# Patient Record
Sex: Male | Born: 1980 | Race: White | Hispanic: No | Marital: Married | Smoking: Current every day smoker
Health system: Southern US, Community
[De-identification: ages and names within clinical notes are randomized; demographics above are authoritative.]

## PROBLEM LIST (undated history)

## (undated) DIAGNOSIS — I10 Essential (primary) hypertension: Secondary | ICD-10-CM

## (undated) HISTORY — PX: ADENOIDECTOMY: SUR15

## (undated) HISTORY — PX: MOUTH SURGERY: SHX715

## (undated) HISTORY — PX: BACK SURGERY: SHX140

---

## 1999-06-08 ENCOUNTER — Emergency Department (HOSPITAL_COMMUNITY): Admission: EM | Admit: 1999-06-08 | Discharge: 1999-06-08 | Payer: Self-pay | Admitting: Endocrinology

## 2001-06-14 ENCOUNTER — Emergency Department (HOSPITAL_COMMUNITY): Admission: EM | Admit: 2001-06-14 | Discharge: 2001-06-14 | Payer: Self-pay | Admitting: *Deleted

## 2004-01-07 ENCOUNTER — Emergency Department (HOSPITAL_COMMUNITY): Admission: AD | Admit: 2004-01-07 | Discharge: 2004-01-07 | Payer: Self-pay | Admitting: Family Medicine

## 2004-04-03 ENCOUNTER — Emergency Department (HOSPITAL_COMMUNITY): Admission: EM | Admit: 2004-04-03 | Discharge: 2004-04-03 | Payer: Self-pay | Admitting: Family Medicine

## 2004-05-11 ENCOUNTER — Emergency Department (HOSPITAL_COMMUNITY): Admission: EM | Admit: 2004-05-11 | Discharge: 2004-05-11 | Payer: Self-pay | Admitting: *Deleted

## 2004-07-10 ENCOUNTER — Emergency Department (HOSPITAL_COMMUNITY): Admission: EM | Admit: 2004-07-10 | Discharge: 2004-07-10 | Payer: Self-pay | Admitting: *Deleted

## 2004-11-21 ENCOUNTER — Emergency Department (HOSPITAL_COMMUNITY): Admission: EM | Admit: 2004-11-21 | Discharge: 2004-11-21 | Payer: Self-pay | Admitting: Family Medicine

## 2005-01-25 ENCOUNTER — Emergency Department (HOSPITAL_COMMUNITY): Admission: EM | Admit: 2005-01-25 | Discharge: 2005-01-25 | Payer: Self-pay | Admitting: Family Medicine

## 2005-04-02 ENCOUNTER — Ambulatory Visit: Payer: Self-pay | Admitting: Internal Medicine

## 2005-04-15 ENCOUNTER — Emergency Department (HOSPITAL_COMMUNITY): Admission: EM | Admit: 2005-04-15 | Discharge: 2005-04-15 | Payer: Self-pay | Admitting: Family Medicine

## 2005-09-09 ENCOUNTER — Ambulatory Visit: Payer: Self-pay | Admitting: Internal Medicine

## 2006-01-19 ENCOUNTER — Emergency Department (HOSPITAL_COMMUNITY): Admission: EM | Admit: 2006-01-19 | Discharge: 2006-01-19 | Payer: Self-pay | Admitting: Emergency Medicine

## 2006-01-21 ENCOUNTER — Emergency Department (HOSPITAL_COMMUNITY): Admission: EM | Admit: 2006-01-21 | Discharge: 2006-01-21 | Payer: Self-pay | Admitting: Emergency Medicine

## 2006-03-16 ENCOUNTER — Emergency Department (HOSPITAL_COMMUNITY): Admission: EM | Admit: 2006-03-16 | Discharge: 2006-03-16 | Payer: Self-pay | Admitting: Emergency Medicine

## 2006-05-01 ENCOUNTER — Emergency Department (HOSPITAL_COMMUNITY): Admission: EM | Admit: 2006-05-01 | Discharge: 2006-05-01 | Payer: Self-pay | Admitting: Family Medicine

## 2006-08-26 ENCOUNTER — Emergency Department (HOSPITAL_COMMUNITY): Admission: EM | Admit: 2006-08-26 | Discharge: 2006-08-26 | Payer: Self-pay | Admitting: Family Medicine

## 2006-08-31 ENCOUNTER — Emergency Department (HOSPITAL_COMMUNITY): Admission: EM | Admit: 2006-08-31 | Discharge: 2006-08-31 | Payer: Self-pay | Admitting: *Deleted

## 2006-09-05 ENCOUNTER — Ambulatory Visit (HOSPITAL_COMMUNITY): Admission: RE | Admit: 2006-09-05 | Discharge: 2006-09-05 | Payer: Self-pay | Admitting: Sports Medicine

## 2006-09-23 ENCOUNTER — Encounter (HOSPITAL_COMMUNITY): Admission: RE | Admit: 2006-09-23 | Discharge: 2006-10-23 | Payer: Self-pay | Admitting: Sports Medicine

## 2007-01-15 ENCOUNTER — Ambulatory Visit (HOSPITAL_COMMUNITY): Admission: RE | Admit: 2007-01-15 | Discharge: 2007-01-15 | Payer: Self-pay | Admitting: Otolaryngology

## 2009-07-25 ENCOUNTER — Emergency Department (HOSPITAL_COMMUNITY): Admission: EM | Admit: 2009-07-25 | Discharge: 2009-07-25 | Payer: Self-pay | Admitting: Emergency Medicine

## 2009-09-28 ENCOUNTER — Emergency Department (HOSPITAL_COMMUNITY): Admission: EM | Admit: 2009-09-28 | Discharge: 2009-09-28 | Payer: Self-pay | Admitting: Family Medicine

## 2009-10-21 ENCOUNTER — Emergency Department (HOSPITAL_COMMUNITY): Admission: EM | Admit: 2009-10-21 | Discharge: 2009-10-21 | Payer: Self-pay | Admitting: Family Medicine

## 2010-01-19 ENCOUNTER — Ambulatory Visit: Payer: Self-pay | Admitting: Internal Medicine

## 2010-12-31 ENCOUNTER — Emergency Department (HOSPITAL_COMMUNITY)
Admission: EM | Admit: 2010-12-31 | Discharge: 2010-12-31 | Payer: Self-pay | Source: Home / Self Care | Admitting: Emergency Medicine

## 2011-01-07 LAB — RAPID STREP SCREEN (MED CTR MEBANE ONLY): Streptococcus, Group A Screen (Direct): NEGATIVE

## 2011-01-13 ENCOUNTER — Encounter: Payer: Self-pay | Admitting: Internal Medicine

## 2011-04-08 ENCOUNTER — Inpatient Hospital Stay (INDEPENDENT_AMBULATORY_CARE_PROVIDER_SITE_OTHER)
Admission: RE | Admit: 2011-04-08 | Discharge: 2011-04-08 | Disposition: A | Discharge status: Home / Self Care | Payer: Self-pay | Source: Ambulatory Visit | Attending: Emergency Medicine | Admitting: Emergency Medicine

## 2011-04-08 DIAGNOSIS — S335XXA Sprain of ligaments of lumbar spine, initial encounter: Secondary | ICD-10-CM

## 2011-05-10 NOTE — Consult Note (Signed)
NAME:  DARCY, CORDNER NO.:  [id number]   MEDICAL RECORD NO.:  [id number]          PATIENT TYPE:  EMS   LOCATION:  MAJO                         FACILITY:  [facility]   PHYSICIAN:  Kristine Garbe. Ezzard Standing, M.D.DATE OF BIRTH:  04/25/1981   DATE OF CONSULTATION:  04/15/2005  DATE OF DISCHARGE:  04/15/2005                                   CONSULTATION   REASON FOR CONSULTATION:  Evaluate patient with epiglottitis.   HISTORY OF PRESENT ILLNESS:  Mr. Zyrell Carmean is a 30 year old gentleman who  has had sore throats for the past two days.  It has been getting worse.  He  was seen at the walk-in clinic with an x-ray, which was consistent with  epiglottitis.  He was referred to the emergency room.  He is on IV.  He  received 1 g of Rocephin.  He states since he has been in the emergency room  since this morning, that he is doing much better.  He is having no airway  difficulty, with a minimal sore throat, and no difficulty talking or  breathing.   PHYSICAL EXAMINATION:  HEENT:  An oral exam revealed generous size 2+  tonsils.  I could visualize the epiglottis at the base of the tongue, with  no significant swelling noted.  A fiberoptic laryngoscopy was performed at  bedside, and on fiberoptic laryngoscopy the right side epiglottis was  perfectly normal.  The left side had a little bit of left clear glottic  swelling.  This was minimal, sitting there on the left periform sinus region  and not causing any airway difficulty.  The remaining vocal cords were  clear.  The glottis was widely patent and clear.  He is having minimal sore  throat presently.  He has no swelling in his neck.  On examination of his  ears, he has a left serous otitis.  He has posterior superior retraction  pockets of both tympanic membranes with what appears to be erosion of the  long process of the incus and questionable early cholesteatoma.   IMPRESSION:  1.  Minimal epiglottitis.  2.  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   RECOMMENDATIONS:  Will give him another gram of Rocephin IV in the emergency  room, and discharge home on Ceftin 500 mg b.i.d. for one week.  Will have  him follow up in my office later this week for a recheck and followup.      CEN/MEDQ  D:  04/15/2005  T:  04/15/2005  Job:  [id number]

## 2012-03-07 ENCOUNTER — Emergency Department (HOSPITAL_COMMUNITY)
Admission: EM | Admit: 2012-03-07 | Discharge: 2012-03-08 | Disposition: A | Discharge status: Home / Self Care | Payer: Medicaid Other | Attending: Emergency Medicine | Admitting: Emergency Medicine

## 2012-03-07 DIAGNOSIS — K047 Periapical abscess without sinus: Secondary | ICD-10-CM

## 2012-03-07 DIAGNOSIS — I10 Essential (primary) hypertension: Secondary | ICD-10-CM

## 2012-03-07 DIAGNOSIS — K044 Acute apical periodontitis of pulpal origin: Secondary | ICD-10-CM | POA: Insufficient documentation

## 2012-03-07 HISTORY — DX: Essential (primary) hypertension: I10

## 2012-03-08 ENCOUNTER — Encounter (HOSPITAL_COMMUNITY): Payer: Self-pay | Admitting: Emergency Medicine

## 2012-03-08 MED ORDER — HYDROCODONE-ACETAMINOPHEN 5-325 MG PO TABS: 1.0000 | ORAL_TABLET | ORAL | Status: AC | PRN

## 2012-03-08 MED ORDER — HYDROCHLOROTHIAZIDE 25 MG PO TABS: 25.0000 mg | ORAL_TABLET | Freq: Every day | ORAL | Status: DC

## 2012-03-08 MED ORDER — PENICILLIN V POTASSIUM 500 MG PO TABS: 500.0000 mg | ORAL_TABLET | Freq: Three times a day (TID) | ORAL | Status: AC

## 2012-03-08 NOTE — ED Provider Notes (Signed)
History     CSN: [id number]  Arrival date & time 03/07/12  2250   None     Chief Complaint  Patient presents with  . Abscess  . Oral Swelling    (Consider location/radiation/quality/duration/timing/severity/associated sxs/prior treatment) HPI History provided by pt.   Pt has had a severe, non-radiating, left lower toothache x 5 days.  Mild relief w/ ibuprofen and ice water.  Associated w/ edema of left jaw but no fever.  Does not currently have a dentist.  Pt also reports that he has been out of his HCTZ for a long time and does not currently have a PCP.   History reviewed. No pertinent past medical history.  Past Surgical History  Procedure Date  . Mouth surgery     History reviewed. No pertinent family history.  History  Substance Use Topics  . Smoking status: Not on file  . Smokeless tobacco: Not on file  . Alcohol Use: Not on file      Review of Systems  All other systems reviewed and are negative.    Allergies  Review of patient's allergies indicates not on file.  Home Medications  No current outpatient prescriptions on file.  BP 156/93  Pulse 87  Temp(Src) 98.5 F (36.9 C) (Oral)  Resp 20  SpO2 99%  Physical Exam  Nursing note and vitals reviewed. Constitutional: He is oriented to person, place, and time. He appears well-developed and well-nourished. No distress.  HENT:  Head: Normocephalic and atraumatic. No trismus in the jaw.  Mouth/Throat: Uvula is midline and oropharynx is clear and moist.       Diffuse dental decay and several missing teeth.  Left lower 1st molar avulsed w/ only a tiny fragment remaining.  3rd molar partially avulsed on medial aspect.  Both decayed and ttp.  Adjacent gingiva and buccal mucosa appears normal; no obvious edema.  Eyes:       Normal appearance  Neck: Normal range of motion. Neck supple.  Lymphadenopathy:    He has no cervical adenopathy.  Neurological: He is alert and oriented to person, place, and time.    Psychiatric: He has a normal mood and affect. His behavior is normal.    ED Course  Procedures (including critical care time)  Labs Reviewed - No data to display No results found.   1. Dental infection   2. Hypertension       MDM  Pt presents w/ left lower toothache.  Possible periapical abscess of 1st or 3rd molar based on exam.  D/c'd home w/ penicillin and vicodin (as well as refill for HCTZ) and referral to dentist on call as well as several low cost dental clinics in [place] and [facility] to find a family physician.  Return precautions discussed.         Otilio Miu, [place] 03/08/12 [phone]

## 2012-03-08 NOTE — ED Provider Notes (Signed)
Medical screening examination/treatment/procedure(s) were performed by non-physician practitioner and as supervising physician I was immediately available for consultation/collaboration.   Gwyneth Sprout, MD 03/08/12 [phone]

## 2012-03-08 NOTE — Discharge Instructions (Signed)
Take antibiotic as prescribed. Take vicodin as prescribed for severe pain.   Do not drive within four hours of taking this medication (may cause drowsiness or confusion).  Take ibuprofen w/ food up to three times a day, as well.  Follow up with a dentist as soon as possible.  Attached is a list of low cost dental clinics in [place] and [place] or you can follow up with the dentist on call.  You should return to the ER if you develop worsening symptoms or facial swelling.  

## 2012-03-08 NOTE — ED Notes (Signed)
Pt presented to the Er with c/o left side pain secondary to the dental pain, pt states noted abscess but not sure from what tooth

## 2013-03-13 ENCOUNTER — Encounter (HOSPITAL_COMMUNITY): Payer: Self-pay | Admitting: *Deleted

## 2013-03-13 ENCOUNTER — Emergency Department (HOSPITAL_COMMUNITY)
Admission: EM | Admit: 2013-03-13 | Discharge: 2013-03-13 | Disposition: A | Discharge status: Home / Self Care | Payer: Medicaid Other | Attending: Emergency Medicine | Admitting: Emergency Medicine

## 2013-03-13 DIAGNOSIS — R109 Unspecified abdominal pain: Secondary | ICD-10-CM | POA: Insufficient documentation

## 2013-03-13 DIAGNOSIS — M549 Dorsalgia, unspecified: Secondary | ICD-10-CM

## 2013-03-13 DIAGNOSIS — M545 Low back pain, unspecified: Secondary | ICD-10-CM | POA: Insufficient documentation

## 2013-03-13 DIAGNOSIS — Z79899 Other long term (current) drug therapy: Secondary | ICD-10-CM | POA: Insufficient documentation

## 2013-03-13 DIAGNOSIS — R110 Nausea: Secondary | ICD-10-CM | POA: Insufficient documentation

## 2013-03-13 DIAGNOSIS — R5383 Other fatigue: Secondary | ICD-10-CM | POA: Insufficient documentation

## 2013-03-13 DIAGNOSIS — F17200 Nicotine dependence, unspecified, uncomplicated: Secondary | ICD-10-CM | POA: Insufficient documentation

## 2013-03-13 DIAGNOSIS — R5381 Other malaise: Secondary | ICD-10-CM | POA: Insufficient documentation

## 2013-03-13 DIAGNOSIS — I10 Essential (primary) hypertension: Secondary | ICD-10-CM | POA: Insufficient documentation

## 2013-03-13 DIAGNOSIS — R51 Headache: Secondary | ICD-10-CM | POA: Insufficient documentation

## 2013-03-13 DIAGNOSIS — R42 Dizziness and giddiness: Secondary | ICD-10-CM

## 2013-03-13 LAB — CBC WITH DIFFERENTIAL/PLATELET
Basophils Absolute: 0.1 10*3/uL (ref 0.0–0.1)
Basophils Relative: 0 % (ref 0–1)
Eosinophils Absolute: 0.2 10*3/uL (ref 0.0–0.7)
Eosinophils Relative: 2 % (ref 0–5)
HCT: 45.9 % (ref 39.0–52.0)
Hemoglobin: 16.6 g/dL (ref 13.0–17.0)
Lymphocytes Relative: 26 % (ref 12–46)
Lymphs Abs: 3.1 10*3/uL (ref 0.7–4.0)
MCH: 30.2 pg (ref 26.0–34.0)
MCHC: 36.2 g/dL — ABNORMAL HIGH (ref 30.0–36.0)
MCV: 83.6 fL (ref 78.0–100.0)
Monocytes Absolute: 0.8 10*3/uL (ref 0.1–1.0)
Monocytes Relative: 7 % (ref 3–12)
Neutro Abs: 7.8 10*3/uL — ABNORMAL HIGH (ref 1.7–7.7)
Neutrophils Relative %: 65 % (ref 43–77)
Platelets: 174 10*3/uL (ref 150–400)
RBC: 5.49 MIL/uL (ref 4.22–5.81)
RDW: 12.5 % (ref 11.5–15.5)
WBC: 12 10*3/uL — ABNORMAL HIGH (ref 4.0–10.5)

## 2013-03-13 LAB — BASIC METABOLIC PANEL
BUN: 13 mg/dL (ref 6–23)
CO2: 28 mEq/L (ref 19–32)
Calcium: 11 mg/dL — ABNORMAL HIGH (ref 8.4–10.5)
Chloride: 100 mEq/L (ref 96–112)
Creatinine, Ser: 0.91 mg/dL (ref 0.50–1.35)
GFR calc Af Amer: >90 mL/min (ref >90)
GFR calc non Af Amer: >90 mL/min (ref >90)
Glucose, Bld: 91 mg/dL (ref 70–99)
Potassium: 4.4 mEq/L (ref 3.5–5.1)
Sodium: 140 mEq/L (ref 135–145)

## 2013-03-13 LAB — URINALYSIS, ROUTINE W REFLEX MICROSCOPIC
Bilirubin Urine: NEGATIVE
Glucose, UA: NEGATIVE mg/dL
Hgb urine dipstick: NEGATIVE
Ketones, ur: NEGATIVE mg/dL
Leukocytes, UA: NEGATIVE
Nitrite: NEGATIVE
Protein, ur: NEGATIVE mg/dL
Specific Gravity, Urine: 1.013 (ref 1.005–1.030)
Urobilinogen, UA: 0.2 mg/dL (ref 0.0–1.0)
pH: 7 (ref 5.0–8.0)

## 2013-03-13 LAB — POCT I-STAT TROPONIN I: Troponin i, poc: 0 ng/mL (ref 0.00–0.08)

## 2013-03-13 NOTE — ED Notes (Signed)
Patient c/o dizziness for about dizziness for the past few days and last night he states that his left arm became numb and was in pain.  Patient also c/o lower back.  Patient has history of htn.  Patient was working tonight at work and his dizziness became really dizzy so he came to work for further evaluation.  Patient has shortness of breath.  Patient also states that he inhaled anti freeze fumes Wednesday night

## 2013-03-13 NOTE — ED Provider Notes (Signed)
History     CSN: [id number]  Arrival date & time 03/13/13  1903   First MD Initiated Contact with Patient 03/13/13 2100      Chief Complaint  Patient presents with  . 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 He had been working for several hours and had not eaten anything and his job involves bending over to get pizzas out of the oven and standing up and twisting and placing them into boxes. He had sudden severe pain in his low back bilaterally which reports almost down to his knees. He denied any numbness or weakness. He reports he's had a chronic problem with dizzy spells, worse when he turns his head or turns his body associated with a moving sensation and occasional mild nausea. He reports that he has had a right inner ear problem in the past. He denies any recent URI symptoms. He reports yesterday he was working on a generator problem and he thinks he was exposed to fumes with a potent odor for several hours although he was working outside. He reports after finishing his work and fixing a problem, he felt very dizzy and lightheaded and did get a bed early and slept most of the evening. He denied any sweats or chest pain. He denies any recent exertional chest pain. He denies also any abdominal pain, groin pain, skin rash. Here upon my evaluation, the patient reports feeling much improved, no longer is having any back pain and denied any hematuria, dysuria or urinary frequency.  The history is provided by the patient and the spouse.    Past Medical History  Diagnosis Date  . Hypertension     Past Surgical History  Procedure Laterality Date  . Mouth surgery      History reviewed. No pertinent family history.  History  Substance Use Topics  . Smoking status: Current Every Day Smoker -- 1.00 packs/day  . Smokeless tobacco: Not on file  . Alcohol  Use: No      Review of Systems  Constitutional: Negative for fever and chills.  Respiratory: Negative for chest tightness and shortness of breath.   Cardiovascular: Negative for chest pain, palpitations and leg swelling.  Gastrointestinal: Negative for nausea and vomiting.  Genitourinary: Positive for flank pain. Negative for dysuria, hematuria, scrotal swelling and testicular pain.  Musculoskeletal: Positive for back pain.  Neurological: Positive for dizziness, weakness and headaches.  All other systems reviewed and are negative.    Allergies  Review of patient's allergies indicates no known allergies.  Home Medications   Current Outpatient Rx  Name  Route  Sig  Dispense  Refill  . aspirin-acetaminophen-caffeine (EXCEDRIN MIGRAINE) 250-250-65 MG per tablet   Oral   Take 1 tablet by mouth every 6 (six) hours as needed for pain.         . hydrochlorothiazide (HYDRODIURIL) 25 MG tablet   Oral   Take 25 mg by mouth daily.         Marland Kitchen ibuprofen (ADVIL,MOTRIN) 200 MG tablet   Oral   Take 800 mg by mouth 3 (three) times daily as needed. For pain         . EXPIRED: hydrochlorothiazide (HYDRODIURIL) 25 MG tablet   Oral   Take 1 tablet (25 mg total) by mouth daily.   30 tablet   0     BP 141/78  Pulse 74  Temp(Src) 98 F (36.7 C) (Oral)  Resp  18  Ht 6\' 5"  (1.956 m)  Wt 330 lb (149.687 kg)  BMI 39.12 kg/m2  SpO2 99%  Physical Exam  Nursing note and vitals reviewed. Constitutional: He is oriented to person, place, and time. He appears well-developed and well-nourished.  HENT:  Head: Normocephalic and atraumatic.  Eyes: EOM are normal. No scleral icterus.  Neck: Normal range of motion. Neck supple. No JVD present.  Cardiovascular: Normal rate and regular rhythm.   No murmur heard. Pulmonary/Chest: Effort normal. No respiratory distress. He has no wheezes.  Abdominal: Soft. He exhibits no distension. There is no tenderness.  Musculoskeletal: He exhibits no  edema.  Neurological: He is alert and oriented to person, place, and time. He displays normal reflexes. No cranial nerve deficit. He exhibits normal muscle tone. Coordination and gait normal. GCS eye subscore is 4. GCS verbal subscore is 5. GCS motor subscore is 6.  Skin: Skin is dry.  Psychiatric: He has a normal mood and affect.    ED Course  Procedures (including critical care time)  Labs Reviewed  CBC WITH DIFFERENTIAL - Abnormal; Notable for the following:    WBC 12.0 (*)    MCHC 36.2 (*)    Neutro Abs 7.8 (*)    All other components within normal limits  BASIC METABOLIC PANEL - Abnormal; Notable for the following:    Calcium 11.0 (*)    All other components within normal limits  URINALYSIS, ROUTINE W REFLEX MICROSCOPIC  POCT I-STAT TROPONIN I   No results found.   1. Back pain   2. Vertigo     EKG at time 19:15, shows sinus tachycardia at a rate of 104, normal axis, normal intervals, no ST or T-wave abnormalities. Interpretation is normal EKG except for tachycardic rate. No prior EKGs are available.  Room air saturation is 99% and I interpret this to be normal  MDM  Patient has multiple complaints, many of which I do not think are necessarily related. At this time the patient feels much improved without any specific intervention. I did do not suspect acute coronary syndrome, acute stroke. It seems the patient may have had some dizziness and vertiginous symptoms secondary to fume inhalation yesterday it seems to be improving. 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 He spent several hours in a hot kitchen bending over and getting pieces of an oven and standing up and placing them into boxes. At this time he has no back pain, no tenderness, urinalysis is unremarkable.        Gavin Pound. Chayil Gantt, MD 03/13/13 2248

## 2013-03-13 NOTE — Discharge Instructions (Signed)
 Back Pain, Adult Low back pain is very common. About 1 in 5 people have back pain.The cause of low back pain is rarely dangerous. The pain often gets better over time.About half of people with a sudden onset of back pain feel better in just 2 weeks. About 8 in 10 people feel better by 6 weeks.  CAUSES Some common causes of back pain include:  Strain of the muscles or ligaments supporting the spine.  Wear and tear (degeneration) of the spinal discs.  Arthritis.  Direct injury to the back. DIAGNOSIS Most of the time, the direct cause of low back pain is not known.However, back pain can be treated effectively even when the exact cause of the pain is unknown.Answering your caregiver's questions about your overall health and symptoms is one of the most accurate ways to make sure the cause of your pain is not dangerous. If your caregiver needs more information, he or she may order lab work or imaging tests (X-rays or MRIs).However, even if imaging tests show changes in your back, this usually does not require surgery. HOME CARE INSTRUCTIONS For many people, back pain returns.Since low back pain is rarely dangerous, it is often a condition that people can learn to [facility] their own.   Remain active. It is stressful on the back to sit or stand in one place. Do not sit, drive, or stand in one place for more than 30 minutes at a time. Take short walks on level surfaces as soon as pain allows.Try to increase the length of time you walk each day.  Do not stay in bed.Resting more than 1 or 2 days can delay your recovery.  Do not avoid exercise or work.Your body is made to move.It is not dangerous to be active, even though your back may hurt.Your back will likely heal faster if you return to being active before your pain is gone.  Pay attention to your body when you bend and lift. Many people have less discomfortwhen lifting if they bend their knees, keep the load close to their bodies,and  avoid twisting. Often, the most comfortable positions are those that put less stress on your recovering back.  Find a comfortable position to sleep. Use a firm mattress and lie on your side with your knees slightly bent. If you lie on your back, put a pillow under your knees.  Only take over-the-counter or prescription medicines as directed by your caregiver. Over-the-counter medicines to reduce pain and inflammation are often the most helpful.Your caregiver may prescribe muscle relaxant drugs.These medicines help dull your pain so you can more quickly return to your normal activities and healthy exercise.  Put ice on the injured area.  Put ice in a plastic bag.  Place a towel between your skin and the bag.  Leave the ice on for 15 to 20 minutes, 3 to 4 times a day for the first 2 to 3 days. After that, ice and heat may be alternated to reduce pain and spasms.  Ask your caregiver about trying back exercises and gentle massage. This may be of some benefit.  Avoid feeling anxious or stressed.Stress increases muscle tension and can worsen back pain.It is important to recognize when you are anxious or stressed and learn ways to manage it.Exercise is a great option. SEEK MEDICAL CARE IF:  You have pain that is not relieved with rest or medicine.  You have pain that does not improve in 1 week.  You have new symptoms.  You are generally  not feeling well. SEEK IMMEDIATE MEDICAL CARE IF:   You have pain that radiates from your back into your legs.  You develop new bowel or bladder control problems.  You have unusual weakness or numbness in your arms or legs.  You develop nausea or vomiting.  You develop abdominal pain.  You feel faint. Document Released: 12/09/2005 Document Revised: 06/09/2012 Document Reviewed: 04/29/2011 [facility] Patient Information 2013 [place], [place].     Vertigo Vertigo means you feel like you or your surroundings are moving when they are not. Vertigo  can be dangerous if it occurs when you are at work, driving, or performing difficult activities.  CAUSES  Vertigo occurs when there is a conflict of signals sent to your brain from the visual and sensory systems in your body. There are many different causes of vertigo, including:  Infections, especially in the inner ear.  A bad reaction to a drug or misuse of alcohol and medicines.  Withdrawal from drugs or alcohol.  Rapidly changing positions, such as lying down or rolling over in bed.  A migraine headache.  Decreased blood flow to the brain.  Increased pressure in the brain from a head injury, infection, tumor, or bleeding. SYMPTOMS  You may feel as though the world is spinning around or you are falling to the ground. Because your balance is upset, vertigo can cause nausea and vomiting. You may have involuntary eye movements (nystagmus). DIAGNOSIS  Vertigo is usually diagnosed by physical exam. If the cause of your vertigo is unknown, your caregiver may perform imaging tests, such as an MRI scan (magnetic resonance imaging). TREATMENT  Most cases of vertigo resolve on their own, without treatment. Depending on the cause, your caregiver may prescribe certain medicines. If your vertigo is related to body position issues, your caregiver may recommend movements or procedures to correct the problem. In rare cases, if your vertigo is caused by certain inner ear problems, you may need surgery. HOME CARE INSTRUCTIONS   Follow your caregiver's instructions.  Avoid driving.  Avoid operating heavy machinery.  Avoid performing any tasks that would be dangerous to you or others during a vertigo episode.  Tell your caregiver if you notice that certain medicines seem to be causing your vertigo. Some of the medicines used to treat vertigo episodes can actually make them worse in some people. SEEK IMMEDIATE MEDICAL CARE IF:   Your medicines do not relieve your vertigo or are making it  worse.  You develop problems with talking, walking, weakness, or using your arms, hands, or legs.  You develop severe headaches.  Your nausea or vomiting continues or gets worse.  You develop visual changes.  A family member notices behavioral changes.  Your condition gets worse. MAKE SURE YOU:  Understand these instructions.  Will watch your condition.  Will get help right away if you are not doing well or get worse. Document Released: 09/18/2005 Document Revised: 03/02/2012 Document Reviewed: 06/27/2011 [facility] Patient Information 2013 [place], [place].

## 2013-03-13 NOTE — ED Notes (Signed)
Pt dc to home.   Pt states understanding to dc instructions.  Pt ambulatory to exit without difficulty.  Pt denies need for w/c. 

## 2013-06-04 ENCOUNTER — Encounter (HOSPITAL_COMMUNITY): Payer: Self-pay | Admitting: *Deleted

## 2013-06-04 ENCOUNTER — Emergency Department (HOSPITAL_COMMUNITY)
Admission: EM | Admit: 2013-06-04 | Discharge: 2013-06-04 | Disposition: A | Discharge status: Home / Self Care | Payer: Self-pay | Attending: Emergency Medicine | Admitting: Emergency Medicine

## 2013-06-04 DIAGNOSIS — F17200 Nicotine dependence, unspecified, uncomplicated: Secondary | ICD-10-CM | POA: Insufficient documentation

## 2013-06-04 DIAGNOSIS — I10 Essential (primary) hypertension: Secondary | ICD-10-CM | POA: Insufficient documentation

## 2013-06-04 DIAGNOSIS — Z79899 Other long term (current) drug therapy: Secondary | ICD-10-CM | POA: Insufficient documentation

## 2013-06-04 DIAGNOSIS — G43909 Migraine, unspecified, not intractable, without status migrainosus: Secondary | ICD-10-CM | POA: Insufficient documentation

## 2013-06-04 DIAGNOSIS — R112 Nausea with vomiting, unspecified: Secondary | ICD-10-CM | POA: Insufficient documentation

## 2013-06-04 MED ORDER — SODIUM CHLORIDE 0.9 % IV SOLN: 1000.0000 mL | INTRAVENOUS | Status: DC

## 2013-06-04 MED ORDER — DIPHENHYDRAMINE HCL 50 MG/ML IJ SOLN
25.0000 mg | Freq: Once | INTRAMUSCULAR | Status: AC
  Administered 2013-06-04: 25 mg via INTRAVENOUS
  Filled 2013-06-04: qty 1

## 2013-06-04 MED ORDER — METOCLOPRAMIDE HCL 10 MG PO TABS: 10.0000 mg | ORAL_TABLET | Freq: Four times a day (QID) | ORAL | Status: DC

## 2013-06-04 MED ORDER — SODIUM CHLORIDE 0.9 % IV SOLN
1000.0000 mL | Freq: Once | INTRAVENOUS | Status: AC
  Administered 2013-06-04: 500 mL via INTRAVENOUS

## 2013-06-04 MED ORDER — METOCLOPRAMIDE HCL 5 MG/ML IJ SOLN
10.0000 mg | Freq: Once | INTRAMUSCULAR | Status: AC
  Administered 2013-06-04: 10 mg via INTRAVENOUS
  Filled 2013-06-04: qty 2

## 2013-06-04 NOTE — ED Provider Notes (Signed)
History     CSN: [id number]  Arrival date & time 06/04/13  8295   First MD Initiated Contact with Patient 06/04/13 [phone]      Chief Complaint  Patient presents with  . Migraine    (Consider location/radiation/quality/duration/timing/severity/associated sxs/prior treatment) Patient is a 32 y.o. male presenting with migraines. The history is provided by the patient.  Migraine  He had onset about 5 PM of a severe, throbbing headache which started at the occiput and radiated up to the frontal area. Headache is worse with exposure to light or noise. There is associated nausea and vomiting. He has not any visual disturbance. He took a dose of Vicodin which did give some slight, temporary relief but pain got worse. He tried to take Excedrin but vomited after taking it. He tried taking a shower without any relief. He's had similar headaches in the past. He is a smoker-1 pack a day.  Past Medical History  Diagnosis Date  . Hypertension     Past Surgical History  Procedure Laterality Date  . Mouth surgery      No family history on file.  History  Substance Use Topics  . Smoking status: Current Every Day Smoker -- 1.00 packs/day  . Smokeless tobacco: Not on file  . Alcohol Use: No      Review of Systems  All other systems reviewed and are negative.    Allergies  Review of patient's allergies indicates no known allergies.  Home Medications   Current Outpatient Rx  Name  Route  Sig  Dispense  Refill  . aspirin-acetaminophen-caffeine (EXCEDRIN MIGRAINE) 250-250-65 MG per tablet   Oral   Take 1 tablet by mouth every 6 (six) hours as needed for pain.         Marland Kitchen HYDROcodone-acetaminophen (NORCO/VICODIN) 5-325 MG per tablet   Oral   Take 1 tablet by mouth every 6 (six) hours as needed for pain.         . hydrochlorothiazide (HYDRODIURIL) 25 MG tablet   Oral   Take 25 mg by mouth daily.           BP 155/75  Pulse 90  Temp(Src) 97.5 F (36.4 C) (Oral)  SpO2  99%  Physical Exam  Nursing note and vitals reviewed.  32 year old male, resting comfortably and in no acute distress. Vital signs are significant for mild hypertension with blood pressure 155/75. Oxygen saturation is 99%, which is normal. Head is normocephalic and atraumatic. PERRLA, EOMI. Oropharynx is clear. Neck is tender at the insertion of the paracervical muscles, neck is supple without adenopathy or JVD. Fundi show no hemorrhage, exudate, or papilledema. Back is nontender and there is no CVA tenderness. Lungs are clear without rales, wheezes, or rhonchi. Chest is nontender. Heart has regular rate and rhythm without murmur. Abdomen is soft, flat, nontender without masses or hepatosplenomegaly and peristalsis is normoactive. Extremities have no cyanosis or edema, full range of motion is present. Skin is warm and dry without rash. Neurologic: Mental status is normal, cranial nerves are intact, there are no motor or sensory deficits.  ED Course  Procedures (including critical care time)   1. Headache       MDM  Headache which may be a migraine and may be muscle contraction headache. No be treated with a headache cocktail of IV fluids, IV metoclopramide, and IV diphenhydramine.  He got excellent relief of headache with above noted treatment and is discharged with a prescription for metoclopramide.  Dione Booze, MD  06/04/13 0723 

## 2013-06-04 NOTE — ED Notes (Signed)
Migraine since yesterday; pt. Took a vicodin when the migraine came on but not effective; tried to take a shower, and prior to coming here x 2 excedrin.  Emesis intermittently.

## 2013-11-02 ENCOUNTER — Emergency Department (INDEPENDENT_AMBULATORY_CARE_PROVIDER_SITE_OTHER)
Admission: EM | Admit: 2013-11-02 | Discharge: 2013-11-02 | Disposition: A | Discharge status: Home / Self Care | Payer: Self-pay | Source: Home / Self Care | Attending: Family Medicine | Admitting: Family Medicine

## 2013-11-02 ENCOUNTER — Emergency Department (INDEPENDENT_AMBULATORY_CARE_PROVIDER_SITE_OTHER): Payer: Self-pay

## 2013-11-02 ENCOUNTER — Encounter (HOSPITAL_COMMUNITY): Payer: Self-pay | Admitting: Emergency Medicine

## 2013-11-02 DIAGNOSIS — J069 Acute upper respiratory infection, unspecified: Secondary | ICD-10-CM

## 2013-11-02 MED ORDER — IPRATROPIUM BROMIDE 0.06 % NA SOLN: 2.0000 | Freq: Four times a day (QID) | NASAL | Status: DC

## 2013-11-02 MED ORDER — HYDROCOD POLST-CHLORPHEN POLST 10-8 MG/5ML PO LQCR: 5.0000 mL | Freq: Two times a day (BID) | ORAL | Status: DC | PRN

## 2013-11-02 NOTE — ED Provider Notes (Signed)
CSN: [id number]     Arrival date & time 11/02/13  1500 History   None    Chief Complaint  Patient presents with  . URI   (Consider location/radiation/quality/duration/timing/severity/associated sxs/prior Treatment) Patient is a 32 y.o. male presenting with URI. The history is provided by the patient.  URI Presenting symptoms: congestion, cough, fever, rhinorrhea and sore throat   Severity:  Moderate Onset quality:  Gradual Progression:  Worsening Chronicity:  New   Past Medical History  Diagnosis Date  . Hypertension    Past Surgical History  Procedure Laterality Date  . Mouth surgery     History reviewed. No pertinent family history. History  Substance Use Topics  . Smoking status: Current Every Day Smoker -- 1.00 packs/day  . Smokeless tobacco: Not on file  . Alcohol Use: No    Review of Systems  Constitutional: Positive for fever and chills.  HENT: Positive for congestion, rhinorrhea and sore throat.   Respiratory: Positive for cough.   Gastrointestinal: Negative.     Allergies  Review of patient's allergies indicates no known allergies.  Home Medications   Current Outpatient Rx  Name  Route  Sig  Dispense  Refill  . aspirin-acetaminophen-caffeine (EXCEDRIN MIGRAINE) 250-250-65 MG per tablet   Oral   Take 1 tablet by mouth every 6 (six) hours as needed for pain.         . chlorpheniramine-HYDROcodone (TUSSIONEX PENNKINETIC ER) 10-8 MG/5ML LQCR   Oral   Take 5 mLs by mouth every 12 (twelve) hours as needed for cough.   115 mL   1   . HYDROcodone-acetaminophen (NORCO/VICODIN) 5-325 MG per tablet   Oral   Take 1 tablet by mouth every 6 (six) hours as needed for pain.         Marland Kitchen ipratropium (ATROVENT) 0.06 % nasal spray   Nasal   Place 2 sprays into the nose 4 (four) times daily.   15 mL   12   . metoCLOPramide (REGLAN) 10 MG tablet   Oral   Take 1 tablet (10 mg total) by mouth every 6 (six) hours.   30 tablet   0    BP 144/85  Pulse 97   Temp(Src) 98.3 F (36.8 C) (Oral)  Resp 16  SpO2 99% Physical Exam  Nursing note and vitals reviewed. Constitutional: He is oriented to person, place, and time. He appears well-developed and well-nourished.  HENT:  Head: Normocephalic.  Right Ear: External ear normal.  Left Ear: External ear normal.  Mouth/Throat: Uvula is midline and mucous membranes are normal. Posterior oropharyngeal edema and posterior oropharyngeal erythema present. No tonsillar abscesses.  Neck: Normal range of motion. Neck supple.  Cardiovascular: Normal rate, regular rhythm and normal heart sounds.   Pulmonary/Chest: Effort normal and breath sounds normal.  Abdominal: Soft. Bowel sounds are normal. There is no tenderness.  Lymphadenopathy:    He has cervical adenopathy.  Neurological: He is alert and oriented to person, place, and time.  Skin: Skin is warm and dry.    ED Course  Procedures (including critical care time) Labs Review Labs Reviewed  POCT RAPID STREP A ([facility] ONLY)   Imaging Review Dg Chest 2 View  11/02/2013   CLINICAL DATA:  Cough, fever.  EXAM: CHEST  2 VIEW  COMPARISON:  None.  FINDINGS: Mild peribronchial thickening. Heart and mediastinal contours are within normal limits. No focal opacities or effusions. No acute bony abnormality.  IMPRESSION: Mild bronchitic changes.   Electronically Signed  By: Charlett Nose M.D.   On: 11/02/2013 16:39    EKG Interpretation     Ventricular Rate:    PR Interval:    QRS Duration:   QT Interval:    QTC Calculation:   R Axis:     Text Interpretation:              MDM  X-rays reviewed and report per radiologist.     Linna Hoff, MD 11/02/13 [phone]

## 2013-11-02 NOTE — ED Notes (Signed)
C/o migraine headache on Friday. Saturday head and chest congestion.  Productive cough with yellow sputum, chills, fever, back and neck pain from cough.  Pt has tried nyquil and ibuprofen with no relief. States wife has pneumonia and kids sick with URI.

## 2013-11-04 LAB — CULTURE, GROUP A STREP

## 2014-02-12 ENCOUNTER — Emergency Department (INDEPENDENT_AMBULATORY_CARE_PROVIDER_SITE_OTHER)
Admission: EM | Admit: 2014-02-12 | Discharge: 2014-02-12 | Disposition: A | Discharge status: Home / Self Care | Payer: Self-pay | Source: Home / Self Care

## 2014-02-12 ENCOUNTER — Encounter (HOSPITAL_COMMUNITY): Payer: Self-pay | Admitting: Emergency Medicine

## 2014-02-12 DIAGNOSIS — K122 Cellulitis and abscess of mouth: Secondary | ICD-10-CM

## 2014-02-12 LAB — POCT RAPID STREP A: STREPTOCOCCUS, GROUP A SCREEN (DIRECT): NEGATIVE

## 2014-02-12 MED ORDER — AMOXICILLIN 500 MG PO CAPS: 1000.0000 mg | ORAL_CAPSULE | Freq: Three times a day (TID) | ORAL | Status: DC

## 2014-02-12 MED ORDER — HYDROCHLOROTHIAZIDE 25 MG PO TABS: 25.0000 mg | ORAL_TABLET | Freq: Every day | ORAL | Status: DC

## 2014-02-12 MED ORDER — PREDNISONE 20 MG PO TABS: 20.0000 mg | ORAL_TABLET | Freq: Two times a day (BID) | ORAL | Status: DC

## 2014-02-12 NOTE — Discharge Instructions (Signed)
Uvulitis °Uvulitis is redness and soreness (inflammation) of the uvula. The uvula is the small tongue-shaped piece of tissue in the back of your mouth.  °CAUSES °Infection is a common cause of uvulitis. Infection of the uvula can be either viral or bacterial. Infectious uvulitis usually only occurs in association with another condition, such as inflammation and infection of the mouth or throat.  °Other causes of uvulitis include: °· Trauma to the uvula. °· Swelling from excess fluid buildup (edema), which may be an allergic reaction. °· Inhalation of irritants, such as chemical agents, smoke, or steam. °DIAGNOSIS °Your caregiver can usually diagnose uvulitis through a physical examination. Bacterial uvulitis can be diagnosed through the results of the growth of samples of bodily substances taken from your mouth (cultures). °HOME CARE INSTRUCTIONS  °· Rest as much as possible. °· Young children may suck on frozen juice bars or frozen ice pops. Older children and adults may gargle with a warm or cold liquid to help soothe the throat. (Mix ¼ tsp of salt in 8 oz of water, or use strong tea.) °· Use a cool-mist humidifier to lessen throat irritation and cough. °· Drink enough fluids to keep your urine clear or pale yellow. °· While the throat is very sore, eat soft or liquid foods such as milk, ice cream, soups, or milk drinks. °· Family members who develop a sore throat or fever should have a medical exam or throat culture. °· If your child has uvulitis and is taking antibiotic medicine, wait 24 hours or until his or her temperature is near normal (less than 100° F [37.8° C]) before allowing him or her to return to school or day care. °· Only take over-the-counter or prescription medicines for pain, discomfort, or fever as directed by your caregiver. °Ask when your test results will be ready. Make sure you get your test results.  °SEEK MEDICAL CARE IF:  °· You have an oral temperature above 102° F (38.9° C). °· You  develop large, tender lumps your the neck. °· Your child develops a rash. °· You cough up green, yellow-brown, or bloody substances. °SEEK IMMEDIATE MEDICAL CARE IF:  °· You develop any new symptoms, such as vomiting, earache, severe headache, stiff neck, chest pain, or trouble breathing or swallowing. °· Your airway is blocked. °· You develop more severe throat pain along with drooling or voice changes. °Document Released: 07/19/2004 Document Revised: 03/02/2012 Document Reviewed: 02/14/2011 °[employer]® Patient Information ©2014 [employer]. ° °

## 2014-02-12 NOTE — ED Provider Notes (Signed)
Chief Complaint   Chief Complaint  Patient presents with  . Sore Throat    History of Present Illness   Nicholas Choi is a 33 year old male who has had a history since this morning of a sore throat, swelling of the uvula, and a sensation of difficulty swallowing, but is able to swallow liquids and solids. He denies any difficulty breathing. He's had no fever, chills, headache, nasal congestion, rhinorrhea, swollen glands, cough, or GI symptoms. He was exposed to strep but this was several weeks ago.   Review of Systems   Other than as noted above, the patient denies any of the following symptoms. Systemic:  No fever, chills, sweats, myalgias, or headache. Eye:  No redness, pain or drainage. ENT:  No earache, nasal congestion, sneezing, rhinorrhea, sinus pressure, sinus pain, or post nasal drip. Lungs:  No cough, sputum production, wheezing, shortness of breath, or chest pain. GI:  No abdominal pain, nausea, vomiting, or diarrhea. Skin:  No rash.  PMFSH   Past medical history, family history, social history, meds, and allergies were reviewed. He has a history of epiglottitis in the past.  Physical Exam     Vital signs:  BP 154/91  Pulse 75  Temp(Src) 98.5 F (36.9 C) (Oral)  Resp 18  SpO2 100% General:  Alert, in no distress. Phonation was normal, no drooling, and patient was able to handle secretions well.  Eye:  No conjunctival injection or drainage. Lids were normal. ENT:  TMs and canals were normal, without erythema or inflammation.  Nasal mucosa was clear and uncongested, without drainage.  Mucous membranes were moist.  Exam of pharynx reveals the uvula to be swollen and edematous, without any ulcers or exudate.  There were no oral ulcerations or lesions. There was no bulging of the tonsillar pillars, and the uvula was midline. Neck:  Supple, no adenopathy, tenderness or mass. Lungs:  No respiratory distress.  Lungs were clear to auscultation, without wheezes, rales or  rhonchi.  Breath sounds were clear and equal bilaterally.  Heart:  Regular rhythm, without gallops, murmers or rubs. Skin:  Clear, warm, and dry, without rash or lesions.  Labs   Results for orders placed during the hospital encounter of 02/12/14  POCT RAPID STREP A ([facility] ONLY)      Result Value Ref Range   Streptococcus, Group A Screen (Direct) NEGATIVE  NEGATIVE   Assessment   The encounter diagnosis was Uvulitis.  There is no evidence of a peritonsillar abscess.    Plan     1.  Meds:  The following meds were prescribed:   New Prescriptions   AMOXICILLIN (AMOXIL) 500 MG CAPSULE    Take 2 capsules (1,000 mg total) by mouth 3 (three) times daily.   HYDROCHLOROTHIAZIDE (HYDRODIURIL) 25 MG TABLET    Take 1 tablet (25 mg total) by mouth daily.   PREDNISONE (DELTASONE) 20 MG TABLET    Take 1 tablet (20 mg total) by mouth 2 (two) times daily.    2.  Patient Education/Counseling:  The patient was given appropriate handouts, self care instructions, and instructed in symptomatic relief, including hot saline gargles, throat lozenges, infectious precautions, and need to trade out toothbrush.    3.  Follow up:  The patient was told to follow up here if no better in 3 to 4 days, or sooner if becoming worse in any way, and given some red flag symptoms such as difficulty swallowing or breathing which would prompt immediate return.  Reuben Likesavid C Kiarra Kidd, MD 02/12/14 [phone]

## 2014-02-12 NOTE — ED Notes (Signed)
Pt reports   sorethroat      With  Pain     When  He  Swallows   With    Some   sensation  Of  Swelling  And  Raw  Area on  Top  Of  Throat

## 2014-02-14 LAB — CULTURE, GROUP A STREP

## 2015-03-24 ENCOUNTER — Encounter (HOSPITAL_COMMUNITY): Payer: Self-pay | Admitting: *Deleted

## 2015-03-24 ENCOUNTER — Emergency Department (INDEPENDENT_AMBULATORY_CARE_PROVIDER_SITE_OTHER)
Admission: EM | Admit: 2015-03-24 | Discharge: 2015-03-24 | Disposition: A | Discharge status: Home / Self Care | Payer: Self-pay | Source: Home / Self Care | Attending: Family Medicine | Admitting: Family Medicine

## 2015-03-24 DIAGNOSIS — I10 Essential (primary) hypertension: Secondary | ICD-10-CM

## 2015-03-24 DIAGNOSIS — J0111 Acute recurrent frontal sinusitis: Secondary | ICD-10-CM

## 2015-03-24 DIAGNOSIS — H7193 Unspecified cholesteatoma, bilateral: Secondary | ICD-10-CM

## 2015-03-24 MED ORDER — FLUTICASONE PROPIONATE 50 MCG/ACT NA SUSP: 1.0000 | Freq: Two times a day (BID) | NASAL | Status: DC

## 2015-03-24 MED ORDER — HYDROCHLOROTHIAZIDE 25 MG PO TABS: 25.0000 mg | ORAL_TABLET | Freq: Every day | ORAL | Status: DC

## 2015-03-24 MED ORDER — CLINDAMYCIN HCL 300 MG PO CAPS: 300.0000 mg | ORAL_CAPSULE | Freq: Three times a day (TID) | ORAL | Status: DC

## 2015-03-24 NOTE — Discharge Instructions (Signed)
Take medicine as prescribed and see dr Ezzard Standingnewman for recheck.

## 2015-03-24 NOTE — ED Notes (Signed)
Pt  Reports        l  Side  Face         With   Symptoms           - pt  Reports        Symptoms  Of  Uri           X  2  Weeks          Pt  Also       Reports     He  Is  Out  Of  His  bp  meds            He  Has  Been    Taking       otc   meds

## 2015-03-24 NOTE — ED Provider Notes (Signed)
CSN: [id number]     Arrival date & time 03/24/15  1021 History   First MD Initiated Contact with Patient 03/24/15 1132     Chief Complaint  Patient presents with  . URI   (Consider location/radiation/quality/duration/timing/severity/associated sxs/prior Treatment) Patient is a 34 y.o. male presenting with URI. The history is provided by the patient.  URI Presenting symptoms: congestion, cough, ear pain and rhinorrhea   Presenting symptoms: no fever   Severity:  Moderate Onset quality:  Gradual Duration:  2 weeks Progression:  Unchanged Chronicity:  New Relieved by:  Nothing Ineffective treatments:  Decongestant   Past Medical History  Diagnosis Date  . Hypertension    Past Surgical History  Procedure Laterality Date  . Mouth surgery     History reviewed. No pertinent family history. History  Substance Use Topics  . Smoking status: Current Every Day Smoker -- 1.00 packs/day  . Smokeless tobacco: Not on file  . Alcohol Use: No    Review of Systems  Constitutional: Negative.  Negative for fever and chills.  HENT: Positive for congestion, ear pain, facial swelling, postnasal drip, rhinorrhea and sinus pressure.   Respiratory: Positive for cough. Negative for shortness of breath.   Cardiovascular: Negative.     Allergies  Review of patient's allergies indicates no known allergies.  Home Medications   Prior to Admission medications   Medication Sig Start Date End Date Taking? Authorizing Provider  amoxicillin (AMOXIL) 500 MG capsule Take 2 capsules (1,000 mg total) by mouth 3 (three) times daily. 02/12/14   Reuben Likes, MD  aspirin-acetaminophen-caffeine (EXCEDRIN MIGRAINE) [id number] MG per tablet Take 1 tablet by mouth every 6 (six) hours as needed for pain.    Historical Provider, MD  chlorpheniramine-HYDROcodone (TUSSIONEX PENNKINETIC ER) 10-8 MG/5ML LQCR Take 5 mLs by mouth every 12 (twelve) hours as needed for cough. 11/02/13   Linna Hoff, MD  clindamycin  (CLEOCIN) 300 MG capsule Take 1 capsule (300 mg total) by mouth 3 (three) times daily. 03/24/15   Linna Hoff, MD  fluticasone (FLONASE) 50 MCG/ACT nasal spray Place 1 spray into both nostrils 2 (two) times daily. 03/24/15   Linna Hoff, MD  hydrochlorothiazide (HYDRODIURIL) 25 MG tablet Take 1 tablet (25 mg total) by mouth daily. 03/24/15   Linna Hoff, MD  HYDROcodone-acetaminophen (NORCO/VICODIN) 5-325 MG per tablet Take 1 tablet by mouth every 6 (six) hours as needed for pain.    Historical Provider, MD  ipratropium (ATROVENT) 0.06 % nasal spray Place 2 sprays into the nose 4 (four) times daily. 11/02/13   Linna Hoff, MD  metoCLOPramide (REGLAN) 10 MG tablet Take 1 tablet (10 mg total) by mouth every 6 (six) hours. 06/04/13   Dione Booze, MD  predniSONE (DELTASONE) 20 MG tablet Take 1 tablet (20 mg total) by mouth 2 (two) times daily. 02/12/14   Reuben Likes, MD   BP 159/109 mmHg  Pulse 78  Temp(Src) 97.7 F (36.5 C) (Oral)  Resp 16  SpO2 100% Physical Exam  Constitutional: He is oriented to person, place, and time. He appears well-developed and well-nourished.  HENT:  Right Ear: Ear canal normal. There is mastoid tenderness. Tympanic membrane is erythematous and retracted. Tympanic membrane mobility is abnormal. A middle ear effusion is present.  Left Ear: Ear canal normal. There is mastoid tenderness. Tympanic membrane is erythematous and retracted. Tympanic membrane mobility is abnormal. A middle ear effusion is present.  Mouth/Throat: Oropharynx is clear and moist.  Neck: Normal range  of motion. Neck supple.  Cardiovascular: Normal rate, regular rhythm, normal heart sounds and intact distal pulses.   Pulmonary/Chest: Effort normal and breath sounds normal.  Lymphadenopathy:    He has no cervical adenopathy.  Neurological: He is alert and oriented to person, place, and time.  Skin: Skin is warm and dry.  Preauricular cyst, sl tender.  Nursing note and vitals reviewed.   ED  Course  Procedures (including critical care time) Labs Review Labs Reviewed - No data to display  Imaging Review No results found.   MDM   1. Cholesteatoma of both ears   2. Essential hypertension   3. Acute recurrent frontal sinusitis       Linna HoffJames D Briannie Gutierrez, MD 03/26/15 1149

## 2015-08-12 ENCOUNTER — Encounter (HOSPITAL_COMMUNITY): Payer: Self-pay | Admitting: *Deleted

## 2015-08-12 ENCOUNTER — Emergency Department (HOSPITAL_COMMUNITY)
Admission: EM | Admit: 2015-08-12 | Discharge: 2015-08-12 | Disposition: A | Discharge status: Home / Self Care | Payer: BLUE CROSS/BLUE SHIELD | Attending: Emergency Medicine | Admitting: Emergency Medicine

## 2015-08-12 ENCOUNTER — Emergency Department (HOSPITAL_COMMUNITY): Payer: BLUE CROSS/BLUE SHIELD

## 2015-08-12 DIAGNOSIS — Z79899 Other long term (current) drug therapy: Secondary | ICD-10-CM | POA: Insufficient documentation

## 2015-08-12 DIAGNOSIS — N201 Calculus of ureter: Secondary | ICD-10-CM | POA: Insufficient documentation

## 2015-08-12 DIAGNOSIS — Z720 Tobacco use: Secondary | ICD-10-CM | POA: Insufficient documentation

## 2015-08-12 DIAGNOSIS — I10 Essential (primary) hypertension: Secondary | ICD-10-CM | POA: Insufficient documentation

## 2015-08-12 DIAGNOSIS — R1031 Right lower quadrant pain: Secondary | ICD-10-CM | POA: Diagnosis present

## 2015-08-12 LAB — CBC WITH DIFFERENTIAL/PLATELET
Basophils Absolute: 0 10*3/uL (ref 0.0–0.1)
Basophils Relative: 0 % (ref 0–1)
EOS PCT: 1 % (ref 0–5)
Eosinophils Absolute: 0.1 10*3/uL (ref 0.0–0.7)
HCT: 47.4 % (ref 39.0–52.0)
HEMOGLOBIN: 16.3 g/dL (ref 13.0–17.0)
LYMPHS ABS: 1.8 10*3/uL (ref 0.7–4.0)
LYMPHS PCT: 10 % — AB (ref 12–46)
MCH: 29.3 pg (ref 26.0–34.0)
MCHC: 34.4 g/dL (ref 30.0–36.0)
MCV: 85.1 fL (ref 78.0–100.0)
Monocytes Absolute: 0.8 10*3/uL (ref 0.1–1.0)
Monocytes Relative: 4 % (ref 3–12)
NEUTROS PCT: 85 % — AB (ref 43–77)
Neutro Abs: 16.1 10*3/uL — ABNORMAL HIGH (ref 1.7–7.7)
Platelets: 150 10*3/uL (ref 150–400)
RBC: 5.57 MIL/uL (ref 4.22–5.81)
RDW: 12.8 % (ref 11.5–15.5)
WBC: 18.8 10*3/uL — AB (ref 4.0–10.5)

## 2015-08-12 LAB — BASIC METABOLIC PANEL
Anion gap: 8 (ref 5–15)
BUN: 18 mg/dL (ref 6–20)
CHLORIDE: 105 mmol/L (ref 101–111)
CO2: 27 mmol/L (ref 22–32)
Calcium: 10.5 mg/dL — ABNORMAL HIGH (ref 8.9–10.3)
Creatinine, Ser: 0.99 mg/dL (ref 0.61–1.24)
GFR calc Af Amer: >60 mL/min (ref >60)
GFR calc non Af Amer: >60 mL/min (ref >60)
Glucose, Bld: 131 mg/dL — ABNORMAL HIGH (ref 65–99)
POTASSIUM: 4 mmol/L (ref 3.5–5.1)
SODIUM: 140 mmol/L (ref 135–145)

## 2015-08-12 LAB — URINALYSIS, ROUTINE W REFLEX MICROSCOPIC
Bilirubin Urine: NEGATIVE
GLUCOSE, UA: NEGATIVE mg/dL
Ketones, ur: NEGATIVE mg/dL
Nitrite: NEGATIVE
PH: 7 (ref 5.0–8.0)
Protein, ur: NEGATIVE mg/dL
SPECIFIC GRAVITY, URINE: 1.022 (ref 1.005–1.030)
Urobilinogen, UA: 0.2 mg/dL (ref 0.0–1.0)

## 2015-08-12 LAB — URINE MICROSCOPIC-ADD ON

## 2015-08-12 MED ORDER — ONDANSETRON HCL 4 MG/2ML IJ SOLN
4.0000 mg | Freq: Once | INTRAMUSCULAR | Status: AC
  Administered 2015-08-12: 4 mg via INTRAVENOUS
  Filled 2015-08-12: qty 2

## 2015-08-12 MED ORDER — ONDANSETRON 4 MG PO TBDP: 4.0000 mg | ORAL_TABLET | Freq: Three times a day (TID) | ORAL | Status: DC | PRN

## 2015-08-12 MED ORDER — MORPHINE SULFATE (PF) 4 MG/ML IV SOLN
4.0000 mg | Freq: Once | INTRAVENOUS | Status: AC
  Administered 2015-08-12: 4 mg via INTRAVENOUS
  Filled 2015-08-12: qty 1

## 2015-08-12 MED ORDER — KETOROLAC TROMETHAMINE 30 MG/ML IJ SOLN
30.0000 mg | Freq: Once | INTRAMUSCULAR | Status: AC
  Administered 2015-08-12: 30 mg via INTRAVENOUS
  Filled 2015-08-12: qty 1

## 2015-08-12 MED ORDER — IOHEXOL 300 MG/ML  SOLN
100.0000 mL | Freq: Once | INTRAMUSCULAR | Status: AC | PRN
  Administered 2015-08-12: 100 mL via INTRAVENOUS

## 2015-08-12 MED ORDER — IOHEXOL 300 MG/ML  SOLN
50.0000 mL | Freq: Once | INTRAMUSCULAR | Status: AC | PRN
  Administered 2015-08-12: 50 mL via ORAL

## 2015-08-12 MED ORDER — OXYCODONE-ACETAMINOPHEN 5-325 MG PO TABS
1.0000 | ORAL_TABLET | Freq: Once | ORAL | Status: AC
  Administered 2015-08-12: 1 via ORAL
  Filled 2015-08-12: qty 1

## 2015-08-12 MED ORDER — TAMSULOSIN HCL 0.4 MG PO CAPS: 0.4000 mg | ORAL_CAPSULE | Freq: Every day | ORAL | Status: DC

## 2015-08-12 MED ORDER — OXYCODONE-ACETAMINOPHEN 5-325 MG PO TABS: 1.0000 | ORAL_TABLET | Freq: Four times a day (QID) | ORAL | Status: DC | PRN

## 2015-08-12 NOTE — ED Provider Notes (Signed)
CSN: [id number]     Arrival date & time 08/12/15  1605 History   First MD Initiated Contact with Patient 08/12/15 1613     Chief Complaint  Patient presents with  . Abdominal Pain     (Consider location/radiation/quality/duration/timing/severity/associated sxs/prior Treatment) HPI Comments: Pt comes in with acute onset of rlq pain that started a couple hours prior arrival. No fever, vomiting or diarrhea. No dysuria. Pt has no history of similar symptoms. Pain goes to groin. No penile discharge.  The history is provided by the patient. No language interpreter was used.    Past Medical History  Diagnosis Date  . Hypertension    Past Surgical History  Procedure Laterality Date  . Mouth surgery     History reviewed. No pertinent family history. Social History  Substance Use Topics  . Smoking status: Current Every Day Smoker -- 1.00 packs/day  . Smokeless tobacco: None  . Alcohol Use: No    Review of Systems  All other systems reviewed and are negative.     Allergies  Review of patient's allergies indicates no known allergies.  Home Medications   Prior to Admission medications   Medication Sig Start Date End Date Taking? Authorizing Provider  acetaminophen (TYLENOL) 500 MG tablet Take 1,000-2,000 mg by mouth every 6 (six) hours as needed for mild pain, moderate pain or headache.   Yes Historical Provider, MD  famotidine (PEPCID) 20 MG tablet Take 20 mg by mouth daily as needed for heartburn or indigestion.   Yes Historical Provider, MD  hydrochlorothiazide (HYDRODIURIL) 25 MG tablet Take 1 tablet (25 mg total) by mouth daily. 03/24/15  Yes Linna Hoff, MD  clindamycin (CLEOCIN) 300 MG capsule Take 1 capsule (300 mg total) by mouth 3 (three) times daily. Patient not taking: Reported on 08/12/2015 03/24/15   Linna Hoff, MD  fluticasone [facility]) 50 MCG/ACT nasal spray Place 1 spray into both nostrils 2 (two) times daily. Patient not taking: Reported on 08/12/2015 03/24/15    Linna Hoff, MD  ipratropium (ATROVENT) 0.06 % nasal spray Place 2 sprays into the nose 4 (four) times daily. Patient not taking: Reported on 08/12/2015 11/02/13   Linna Hoff, MD  metoCLOPramide (REGLAN) 10 MG tablet Take 1 tablet (10 mg total) by mouth every 6 (six) hours. Patient not taking: Reported on 08/12/2015 06/04/13   Dione Booze, MD   BP 179/84 mmHg  Pulse 84  Temp(Src) 97.5 F (36.4 C) (Oral)  Resp 22  SpO2 100% Physical Exam  Constitutional: He is oriented to person, place, and time. He appears well-developed and well-nourished.  Cardiovascular: Normal rate and regular rhythm.   Pulmonary/Chest: Effort normal and breath sounds normal.  Abdominal: Soft. Bowel sounds are normal. There is tenderness in the right lower quadrant.  Genitourinary: Penis normal.  Musculoskeletal: Normal range of motion.  Neurological: He is alert and oriented to person, place, and time.  Skin: Skin is warm and dry.  Psychiatric: He has a normal mood and affect.  Nursing note and vitals reviewed.   ED Course  Procedures (including critical care time) Labs Review Labs Reviewed  BASIC METABOLIC PANEL - Abnormal; Notable for the following:    Glucose, Bld 131 (*)    Calcium 10.5 (*)    All other components within normal limits  CBC WITH DIFFERENTIAL/PLATELET - Abnormal; Notable for the following:    WBC 18.8 (*)    Neutrophils Relative % 85 (*)    Neutro Abs 16.1 (*)  Lymphocytes Relative 10 (*)    All other components within normal limits  URINALYSIS, ROUTINE W REFLEX MICROSCOPIC (NOT AT [facility]) - Abnormal; Notable for the following:    APPearance CLOUDY (*)    Hgb urine dipstick LARGE (*)    Leukocytes, UA SMALL (*)    All other components within normal limits  URINE MICROSCOPIC-ADD ON - Abnormal; Notable for the following:    Bacteria, UA FEW (*)    All other components within normal limits  URINE CULTURE    Imaging Review Ct Abdomen Pelvis W Contrast  08/12/2015   CLINICAL  DATA:  RIGHT lower quadrant pain. Onset of symptoms at 2 p.m. Initial encounter.  EXAM: CT ABDOMEN AND PELVIS WITH CONTRAST  TECHNIQUE: Multidetector CT imaging of the abdomen and pelvis was performed using the standard protocol following bolus administration of intravenous contrast.  CONTRAST:  50mL OMNIPAQUE IOHEXOL 300 MG/ML SOLN, OMNIPAQUE IOHEXOL 300 MG/ML SOLN  COMPARISON:  01/21/2006.  FINDINGS: Musculoskeletal: lumbar spondylosis. Degenerative sclerosis of the inferior sacroiliac joints bilaterally.  Lung Bases: Clear.  Liver: Unenhanced CT was performed per clinician order. Lack of IV contrast limits sensitivity and specificity, especially for evaluation of abdominal/pelvic solid viscera. Normal.  Spleen:  Normal.  Gallbladder:  Normal.  Common bile duct:  Normal.  Pancreas:  Normal.  Adrenal glands:  Normal bilaterally.  Kidneys: LEFT kidney and LEFT ureter normal. There is mild RIGHT peripelvic renal stranding and a minimal hydronephrosis. 3 mm RIGHT UPJ stone is present (image 47 series 3). No residual collecting system calculi. The distal RIGHT ureter is mildly ectatic. No distal RIGHT ureteral stones.  Stomach:  Normal.  Small bowel:  Normal.  No mesenteric adenopathy.  Colon: Normal appendix. No inflammatory changes of colon. Rectum appears normal.  Pelvic Genitourinary: Normal urinary bladder. Prostate gland is normal.  Peritoneum: No free fluid or free air.  Vascular/lymphatic: Grossly normal allowing for noncontrast technique. Incidental note is made of retroaortic LEFT renal vein.  Body Wall: Fat containing periumbilical hernia.  IMPRESSION: 1. 3 mm RIGHT UPJ calculus with minimal RIGHT hydronephrosis. 2. No residual collecting system calculi.   Electronically Signed   By: Andreas Newport M.D.   On: 08/12/2015 18:25   I have personally reviewed and evaluated these images and lab results as part of my medical decision-making.   EKG Interpretation None      MDM   Final diagnoses:   Ureteral stone    Pt is comfortable at this time. Pt is okay to follow up with urology. No infection noted in urine. Will send home with oxycodone, flomax and zofran    Teressa Lower, NP 08/12/15 1915  Derwood Kaplan, MD 08/14/15 1705

## 2015-08-12 NOTE — ED Notes (Signed)
Abdominal pain began about 3pm today radiates to groin + nausea and vomiting Neg. diarrhea

## 2015-08-12 NOTE — ED Notes (Signed)
Unsuccessful at getting labs, nurse notified

## 2015-08-12 NOTE — ED Notes (Addendum)
Pt aware that urine sample is needed.  Unable to void at this time.  

## 2015-08-12 NOTE — Discharge Instructions (Signed)

## 2015-08-12 NOTE — ED Notes (Signed)
Bed: ZO10 Expected date: 08/12/15 Expected time: 4:00 PM Means of arrival: Ambulance Comments: abd pain

## 2015-08-14 LAB — URINE CULTURE

## 2016-10-09 ENCOUNTER — Institutional Professional Consult (permissible substitution): Payer: BLUE CROSS/BLUE SHIELD | Admitting: Neurology

## 2017-03-08 ENCOUNTER — Encounter (HOSPITAL_COMMUNITY): Payer: Self-pay | Admitting: Nurse Practitioner

## 2017-03-08 ENCOUNTER — Emergency Department (HOSPITAL_COMMUNITY): Payer: Medicaid Other

## 2017-03-08 ENCOUNTER — Emergency Department (HOSPITAL_COMMUNITY)
Admission: EM | Admit: 2017-03-08 | Discharge: 2017-03-08 | Disposition: A | Discharge status: Home / Self Care | Payer: Medicaid Other | Attending: Emergency Medicine | Admitting: Emergency Medicine

## 2017-03-08 DIAGNOSIS — M25561 Pain in right knee: Secondary | ICD-10-CM | POA: Diagnosis present

## 2017-03-08 DIAGNOSIS — M5432 Sciatica, left side: Secondary | ICD-10-CM | POA: Diagnosis not present

## 2017-03-08 DIAGNOSIS — L089 Local infection of the skin and subcutaneous tissue, unspecified: Secondary | ICD-10-CM

## 2017-03-08 DIAGNOSIS — F17200 Nicotine dependence, unspecified, uncomplicated: Secondary | ICD-10-CM | POA: Insufficient documentation

## 2017-03-08 DIAGNOSIS — Z79899 Other long term (current) drug therapy: Secondary | ICD-10-CM | POA: Diagnosis not present

## 2017-03-08 DIAGNOSIS — M1711 Unilateral primary osteoarthritis, right knee: Secondary | ICD-10-CM

## 2017-03-08 DIAGNOSIS — L818 Other specified disorders of pigmentation: Secondary | ICD-10-CM | POA: Diagnosis not present

## 2017-03-08 DIAGNOSIS — I10 Essential (primary) hypertension: Secondary | ICD-10-CM | POA: Diagnosis not present

## 2017-03-08 DIAGNOSIS — T148XXA Other injury of unspecified body region, initial encounter: Secondary | ICD-10-CM

## 2017-03-08 MED ORDER — HYDROCODONE-ACETAMINOPHEN 5-325 MG PO TABS: 2.0000 | ORAL_TABLET | ORAL | 0 refills | Status: DC | PRN

## 2017-03-08 MED ORDER — MELOXICAM 7.5 MG PO TABS: 7.5000 mg | ORAL_TABLET | Freq: Every day | ORAL | 0 refills | Status: DC

## 2017-03-08 MED ORDER — HYDROCODONE-ACETAMINOPHEN 5-325 MG PO TABS
2.0000 | ORAL_TABLET | Freq: Once | ORAL | Status: AC
  Administered 2017-03-08: 2 via ORAL
  Filled 2017-03-08: qty 2

## 2017-03-08 MED ORDER — SULFAMETHOXAZOLE-TRIMETHOPRIM 200-40 MG/5ML PO SUSP: 10.0000 mL | Freq: Two times a day (BID) | ORAL | 0 refills | Status: DC

## 2017-03-08 NOTE — ED Triage Notes (Signed)
Pt presents with c/o skin infection and lower extremity pain. The skin infection began on last Saturday. He noticed it after getting a new tattoo and working on the yard the day before. He has reddened areas to R posterior upper arm above his elbow. He has been able to express purulent drainage from one of the wounds with manual pressure and the wounds have decreased in size. The lower extremity pain began 2 months ago after he remodeled his shower. He has L hip/buttock and R knee pain. The hip pain radiates down his leg at times and feels like a burning, tingling pain. He denies any weakness. He has been applying heat and taking ibuprofen with no relief.

## 2017-03-08 NOTE — ED Notes (Signed)
Declined W/C at D/C and was escorted to lobby by RN. 

## 2017-03-08 NOTE — ED Provider Notes (Signed)
[facility]-EMERGENCY DEPT Provider Note    By signing my name below, I, Earmon Phoenix, attest that this documentation has been prepared under the direction and in the presence of Langston Masker, [place]. Electronically Signed: Earmon Phoenix, ED Scribe. 03/08/17. 1:40 PM.    History   Chief Complaint Chief Complaint  Patient presents with  . Skin Problem  . Hip Pain  . Knee Pain    The history is provided by the patient and medical records. No language interpreter was used.    Nicholas Choi is an obese 36 y.o. male with PMHx of HTN who presents to the Emergency Department complaining of moderate stabbing left hip pain that began approximately one week ago. He reports associated right knee pain and swelling as well that began two months ago. Pt also reports a red draining lesion and red bumps to the upper right extremity. He reports getting a new tattoo last week but is unsure if that is what has caused the rash. He states he was doing some yard work a few days ago and the rash appeared shortly after that day. He reports some purulent drainage from the area. He has not taken anything for pain relief. Lying down increases the back pain, walking increases the knee pain. He denies alleviating factors. He denies modifying factors of the rash. He denies fever, chills, nausea, vomiting, numbness, tingling or weakness of the lower extremities, bowel or bladder incontinence, bruising, wounds, trauma, injury or fall. He denies allergies to any medications.    Past Medical History:  Diagnosis Date  . Hypertension     There are no active problems to display for this patient.   Past Surgical History:  Procedure Laterality Date  . MOUTH SURGERY         Home Medications    Prior to Admission medications   Medication Sig Start Date End Date Taking? Authorizing Provider  acetaminophen (TYLENOL) 500 MG tablet Take 1,000-2,000 mg by mouth every 6 (six) hours as needed for mild pain, moderate pain  or headache.    Historical Provider, MD  clindamycin (CLEOCIN) 300 MG capsule Take 1 capsule (300 mg total) by mouth 3 (three) times daily. Patient not taking: Reported on 08/12/2015 03/24/15   Linna Hoff, MD  famotidine (PEPCID) 20 MG tablet Take 20 mg by mouth daily as needed for heartburn or indigestion.    Historical Provider, MD  fluticasone (FLONASE) 50 MCG/ACT nasal spray Place 1 spray into both nostrils 2 (two) times daily. Patient not taking: Reported on 08/12/2015 03/24/15   Linna Hoff, MD  hydrochlorothiazide (HYDRODIURIL) 25 MG tablet Take 1 tablet (25 mg total) by mouth daily. 03/24/15   Linna Hoff, MD  HYDROcodone-acetaminophen (NORCO/VICODIN) 5-325 MG tablet Take 2 tablets by mouth every 4 (four) hours as needed. 03/08/17   Elson Areas, PA-C  ipratropium (ATROVENT) 0.06 % nasal spray Place 2 sprays into the nose 4 (four) times daily. Patient not taking: Reported on 08/12/2015 11/02/13   Linna Hoff, MD  meloxicam (MOBIC) 7.5 MG tablet Take 1 tablet (7.5 mg total) by mouth daily. 03/08/17   Elson Areas, PA-C  metoCLOPramide (REGLAN) 10 MG tablet Take 1 tablet (10 mg total) by mouth every 6 (six) hours. Patient not taking: Reported on 08/12/2015 06/04/13   Dione Booze, MD  ondansetron (ZOFRAN ODT) 4 MG disintegrating tablet Take 1 tablet (4 mg total) by mouth every 8 (eight) hours as needed for nausea or vomiting. 08/12/15   Teressa Lower,  NP  oxyCODONE-acetaminophen (PERCOCET/ROXICET) 5-325 MG per tablet Take 1-2 tablets by mouth every 6 (six) hours as needed for severe pain. 08/12/15   Teressa LowerVrinda Pickering, NP  sulfamethoxazole-trimethoprim (BACTRIM,SEPTRA) 200-40 MG/5ML suspension Take 10 mLs by mouth 2 (two) times daily. 03/08/17   Elson AreasLeslie K Sofia, PA-C  tamsulosin (FLOMAX) 0.4 MG CAPS capsule Take 1 capsule (0.4 mg total) by mouth daily. 08/12/15   Teressa LowerVrinda Pickering, NP    Family History History reviewed. No pertinent family history.  Social History Social History  Substance  Use Topics  . Smoking status: Current Every Day Smoker    Packs/day: 1.00  . Smokeless tobacco: Not on file  . Alcohol use No     Allergies   Patient has no known allergies.   Review of Systems Review of Systems  Musculoskeletal: Positive for arthralgias and myalgias.  Skin: Positive for color change.  All other systems reviewed and are negative.    Physical Exam Updated Vital Signs BP (!) 153/93 (BP Location: Left Arm)   Pulse (!) 101   Temp 98 F (36.7 C) (Oral)   Resp (!) 22   SpO2 98%   Physical Exam  Constitutional: He is oriented to person, place, and time. He appears well-developed and well-nourished.  HENT:  Head: Normocephalic and atraumatic.  Neck: Normal range of motion.  Cardiovascular: Normal rate.   Pulmonary/Chest: Effort normal.  Musculoskeletal: Normal range of motion. He exhibits edema and tenderness. He exhibits no deformity.  Diffusely tender lumbar spine. Effusion to the right knee.  Neurological: He is alert and oriented to person, place, and time.  Skin: Skin is warm and dry. Rash noted.  Red raised rash with small pimples and yellow honeycomb colored drainage around new tattoo of RUE concerning for MRSA.  Psychiatric: He has a normal mood and affect. His behavior is normal.  Nursing note and vitals reviewed.    ED Treatments / Results  DIAGNOSTIC STUDIES: Oxygen Saturation is 98% on RA, normal by my interpretation.   COORDINATION OF CARE: 12:15 PM- Will prescribe Septra and X-Ray right knee and lumbar spine. Pt verbalizes understanding and agrees to plan.  Medications  HYDROcodone-acetaminophen (NORCO/VICODIN) 5-325 MG per tablet 2 tablet (2 tablets Oral Given 03/08/17 1234)    Labs (all labs ordered are listed, but only abnormal results are displayed) Labs Reviewed - No data to display  EKG  EKG Interpretation None       Radiology Dg Lumbar Spine Complete  Result Date: 03/08/2017 CLINICAL DATA:  Stabbing pain radiating to  the left buttocks. EXAM: LUMBAR SPINE - COMPLETE 4+ VIEW COMPARISON:  Lumbar spine radiographs - 07/25/2009; CT abdomen and pelvis - 08/12/2015 FINDINGS: There are 5 non rib-bearing lumbar type vertebral bodies. There is a mild scoliotic curvature of the thoracolumbar spine with dominant caudal component convex to the left measuring approximately 10 degrees (image as measured from the superior endplate of T12 to the inferior endplate of L4). No anterolisthesis or retrolisthesis.  No definite pars defects. Mild (under 25%) compression deformity involving the T11 vertebral body, similar to remote abdominal CT performed 07/2015. Lumbar vertebral body heights appear preserved. Mild multilevel lumbar spine DDD, likely worse at L4-L5 with disc space height loss, endplate irregularity and sclerosis. Limited visualization of the bilateral SI joints is normal. Regional bowel gas pattern and soft tissues are normal. IMPRESSION: 1. No acute findings. 2. Mild multilevel lumbar spine DDD, worse at L4-L5. Electronically Signed   By: Simonne ComeJohn  Watts M.D.   On: 03/08/2017 13:23  Dg Knee Complete 4 Views Right  Result Date: 03/08/2017 CLINICAL DATA:  Right knee pain and swelling for the past 2 months. EXAM: RIGHT KNEE - COMPLETE 4+ VIEW COMPARISON:  None. FINDINGS: No fracture or dislocation. Mild tricompartmental degenerative change of the knee, worse within the medial compartment with joint space loss and subchondral sclerosis. No evidence of chondrocalcinosis. Small knee joint effusion. There is a minimal amount of osteophytosis involving the superior and inferior poles of the patella. Regional soft tissues appear normal. IMPRESSION: 1. Small knee joint effusion, otherwise, no acute findings. 2. Mild tricompartmental degenerative change of the knee. Electronically Signed   By: Simonne Come M.D.   On: 03/08/2017 13:19    Procedures Procedures (including critical care time)  Medications Ordered in ED Medications    HYDROcodone-acetaminophen (NORCO/VICODIN) 5-325 MG per tablet 2 tablet (2 tablets Oral Given 03/08/17 1234)     Initial Impression / Assessment and Plan / ED Course  I have reviewed the triage vital signs and the nursing notes.  Pertinent labs & imaging results that were available during my care of the patient were reviewed by me and considered in my medical decision making (see chart for details).     Patient is a 36 y.o. male with a hx of HTN who presents to the ED with back pain consistent with sciatica, right knee pain and a rash to the RUE. No neurological deficits appreciated. Patient is ambulatory. No warning symptoms of back pain including: fecal incontinence, urinary retention or overflow incontinence, night sweats, waking from sleep with back pain, unexplained fevers or weight loss, h/o cancer, IVDU, recent trauma. No concern for cauda equina, epidural abscess, or other serious cause of back pain. Patient X-Ray of lumbar spine and right knee negative for obvious fracture or dislocation. Pt advised to follow up with orthopedics. Patient given knee sleeve while in ED, conservative therapy recommended and discussed. Patient also with skin infection of RUE. Will treat with Septra  No signs of secondary infection. Follow up with PCP in 2-3 days. Return precautions discussed. Patient will be discharged home & is agreeable with above plan. Pt appears safe for discharge.    Final Clinical Impressions(s) / ED Diagnoses   Final diagnoses:  Arthritis of right knee  Sciatica, left side  Wound infection    New Prescriptions New Prescriptions   HYDROCODONE-ACETAMINOPHEN (NORCO/VICODIN) 5-325 MG TABLET    Take 2 tablets by mouth every 4 (four) hours as needed.   MELOXICAM (MOBIC) 7.5 MG TABLET    Take 1 tablet (7.5 mg total) by mouth daily.   SULFAMETHOXAZOLE-TRIMETHOPRIM (BACTRIM,SEPTRA) 200-40 MG/5ML SUSPENSION    Take 10 mLs by mouth 2 (two) times daily.  An After Visit Summary was printed  and given to the patient. Pt advised to follow up with Orthopaedist for evaluation of back pain and athritis to right knee. I personally performed the services in this documentation, which was scribed in my presence.  The recorded information has been reviewed and considered.   Barnet Pall.    Lonia Skinner Pawlet, PA-C 03/08/17 1347    Donnetta Hutching, MD 03/09/17 [phone]

## 2017-03-08 NOTE — Discharge Instructions (Signed)
Schedule to see Dr. Dion SaucierLandau for recheck of knee and back.  Take antibiotics as diredted

## 2017-04-16 ENCOUNTER — Other Ambulatory Visit (HOSPITAL_COMMUNITY): Payer: Self-pay | Admitting: Internal Medicine

## 2017-04-16 DIAGNOSIS — G8929 Other chronic pain: Secondary | ICD-10-CM

## 2017-04-16 DIAGNOSIS — M545 Low back pain, unspecified: Secondary | ICD-10-CM

## 2017-04-17 ENCOUNTER — Ambulatory Visit (HOSPITAL_COMMUNITY)
Admission: RE | Admit: 2017-04-17 | Discharge: 2017-04-17 | Disposition: A | Discharge status: Home / Self Care | Payer: Medicaid Other | Source: Ambulatory Visit | Attending: Internal Medicine | Admitting: Internal Medicine

## 2017-04-17 DIAGNOSIS — M5410 Radiculopathy, site unspecified: Secondary | ICD-10-CM | POA: Diagnosis present

## 2017-04-17 DIAGNOSIS — M545 Low back pain, unspecified: Secondary | ICD-10-CM

## 2017-04-17 DIAGNOSIS — M48061 Spinal stenosis, lumbar region without neurogenic claudication: Secondary | ICD-10-CM | POA: Insufficient documentation

## 2017-04-17 DIAGNOSIS — M5127 Other intervertebral disc displacement, lumbosacral region: Secondary | ICD-10-CM | POA: Insufficient documentation

## 2017-04-17 DIAGNOSIS — M5126 Other intervertebral disc displacement, lumbar region: Secondary | ICD-10-CM | POA: Diagnosis not present

## 2017-04-17 DIAGNOSIS — G8929 Other chronic pain: Secondary | ICD-10-CM | POA: Insufficient documentation

## 2017-06-30 ENCOUNTER — Emergency Department (HOSPITAL_COMMUNITY): Payer: 59

## 2017-06-30 ENCOUNTER — Encounter (HOSPITAL_COMMUNITY): Payer: Self-pay | Admitting: Oncology

## 2017-06-30 ENCOUNTER — Inpatient Hospital Stay (HOSPITAL_COMMUNITY): Payer: 59 | Admitting: Certified Registered Nurse Anesthetist

## 2017-06-30 ENCOUNTER — Inpatient Hospital Stay (HOSPITAL_COMMUNITY): Payer: 59

## 2017-06-30 ENCOUNTER — Encounter (HOSPITAL_COMMUNITY)
Admission: EM | Disposition: A | Discharge status: Home / Self Care | Payer: Self-pay | Source: Home / Self Care | Attending: Neurosurgery

## 2017-06-30 ENCOUNTER — Inpatient Hospital Stay (HOSPITAL_COMMUNITY)
Admission: EM | Admit: 2017-06-30 | Discharge: 2017-07-01 | DRG: 519 | Disposition: A | Discharge status: Home / Self Care | Payer: 59 | Attending: Neurosurgery | Admitting: Neurosurgery

## 2017-06-30 DIAGNOSIS — I10 Essential (primary) hypertension: Secondary | ICD-10-CM | POA: Diagnosis not present

## 2017-06-30 DIAGNOSIS — K219 Gastro-esophageal reflux disease without esophagitis: Secondary | ICD-10-CM | POA: Diagnosis not present

## 2017-06-30 DIAGNOSIS — M5459 Other low back pain: Secondary | ICD-10-CM | POA: Diagnosis present

## 2017-06-30 DIAGNOSIS — Z720 Tobacco use: Secondary | ICD-10-CM | POA: Diagnosis not present

## 2017-06-30 DIAGNOSIS — M545 Low back pain, unspecified: Secondary | ICD-10-CM

## 2017-06-30 DIAGNOSIS — M48061 Spinal stenosis, lumbar region without neurogenic claudication: Secondary | ICD-10-CM | POA: Diagnosis present

## 2017-06-30 DIAGNOSIS — M5126 Other intervertebral disc displacement, lumbar region: Secondary | ICD-10-CM | POA: Diagnosis present

## 2017-06-30 DIAGNOSIS — M5416 Radiculopathy, lumbar region: Secondary | ICD-10-CM

## 2017-06-30 DIAGNOSIS — F17210 Nicotine dependence, cigarettes, uncomplicated: Secondary | ICD-10-CM | POA: Diagnosis present

## 2017-06-30 DIAGNOSIS — M4620 Osteomyelitis of vertebra, site unspecified: Secondary | ICD-10-CM | POA: Insufficient documentation

## 2017-06-30 DIAGNOSIS — Z79899 Other long term (current) drug therapy: Secondary | ICD-10-CM

## 2017-06-30 DIAGNOSIS — M5116 Intervertebral disc disorders with radiculopathy, lumbar region: Principal | ICD-10-CM | POA: Diagnosis present

## 2017-06-30 DIAGNOSIS — Z419 Encounter for procedure for purposes other than remedying health state, unspecified: Secondary | ICD-10-CM

## 2017-06-30 DIAGNOSIS — Z791 Long term (current) use of non-steroidal anti-inflammatories (NSAID): Secondary | ICD-10-CM

## 2017-06-30 DIAGNOSIS — Z6841 Body Mass Index (BMI) 40.0 and over, adult: Secondary | ICD-10-CM

## 2017-06-30 DIAGNOSIS — R6510 Systemic inflammatory response syndrome (SIRS) of non-infectious origin without acute organ dysfunction: Secondary | ICD-10-CM

## 2017-06-30 HISTORY — PX: LUMBAR WOUND DEBRIDEMENT: SHX1988

## 2017-06-30 LAB — I-STAT CG4 LACTIC ACID, ED: Lactic Acid, Venous: 1.89 mmol/L (ref 0.5–1.9)

## 2017-06-30 LAB — CBC WITH DIFFERENTIAL/PLATELET
Basophils Absolute: 0 10*3/uL (ref 0.0–0.1)
Basophils Relative: 0 %
EOS ABS: 0 10*3/uL (ref 0.0–0.7)
Eosinophils Relative: 0 %
HEMATOCRIT: 48.2 % (ref 39.0–52.0)
Hemoglobin: 16.3 g/dL (ref 13.0–17.0)
LYMPHS ABS: 1.4 10*3/uL (ref 0.7–4.0)
Lymphocytes Relative: 11 %
MCH: 28.7 pg (ref 26.0–34.0)
MCHC: 33.8 g/dL (ref 30.0–36.0)
MCV: 84.9 fL (ref 78.0–100.0)
Monocytes Absolute: 0.2 10*3/uL (ref 0.1–1.0)
Monocytes Relative: 2 %
NEUTROS ABS: 10.7 10*3/uL — AB (ref 1.7–7.7)
NEUTROS PCT: 87 %
Platelets: 176 10*3/uL (ref 150–400)
RBC: 5.68 MIL/uL (ref 4.22–5.81)
RDW: 13.1 % (ref 11.5–15.5)
WBC: 12.4 10*3/uL — AB (ref 4.0–10.5)

## 2017-06-30 LAB — COMPREHENSIVE METABOLIC PANEL
ALBUMIN: 4.1 g/dL (ref 3.5–5.0)
ALK PHOS: 86 U/L (ref 38–126)
ALT: 12 U/L — ABNORMAL LOW (ref 17–63)
ANION GAP: 9 (ref 5–15)
AST: 19 U/L (ref 15–41)
BUN: 11 mg/dL (ref 6–20)
CALCIUM: 10.9 mg/dL — AB (ref 8.9–10.3)
CHLORIDE: 107 mmol/L (ref 101–111)
CO2: 22 mmol/L (ref 22–32)
Creatinine, Ser: 0.91 mg/dL (ref 0.61–1.24)
GFR calc non Af Amer: >60 mL/min (ref >60)
Glucose, Bld: 116 mg/dL — ABNORMAL HIGH (ref 65–99)
POTASSIUM: 4.1 mmol/L (ref 3.5–5.1)
SODIUM: 138 mmol/L (ref 135–145)
Total Bilirubin: 0.8 mg/dL (ref 0.3–1.2)
Total Protein: 7.3 g/dL (ref 6.5–8.1)

## 2017-06-30 LAB — URINALYSIS, ROUTINE W REFLEX MICROSCOPIC
Bilirubin Urine: NEGATIVE
Glucose, UA: NEGATIVE mg/dL
Hgb urine dipstick: NEGATIVE
KETONES UR: NEGATIVE mg/dL
LEUKOCYTES UA: NEGATIVE
NITRITE: NEGATIVE
PH: 7 (ref 5.0–8.0)
PROTEIN: NEGATIVE mg/dL
Specific Gravity, Urine: 1.002 — ABNORMAL LOW (ref 1.005–1.030)

## 2017-06-30 LAB — PROTIME-INR
INR: 1.03
PROTHROMBIN TIME: 13.5 s (ref 11.4–15.2)

## 2017-06-30 LAB — SURGICAL PCR SCREEN
MRSA, PCR: NEGATIVE
STAPHYLOCOCCUS AUREUS: NEGATIVE

## 2017-06-30 SURGERY — LUMBAR WOUND DEBRIDEMENT
Anesthesia: General | Site: Spine Lumbar

## 2017-06-30 MED ORDER — CEFAZOLIN SODIUM-DEXTROSE 2-4 GM/100ML-% IV SOLN
2.0000 g | Freq: Three times a day (TID) | INTRAVENOUS | Status: AC
  Administered 2017-06-30 – 2017-07-01 (×2): 2 g via INTRAVENOUS
  Filled 2017-06-30 (×2): qty 100

## 2017-06-30 MED ORDER — PANTOPRAZOLE SODIUM 40 MG IV SOLR
40.0000 mg | Freq: Every day | INTRAVENOUS | Status: DC
  Administered 2017-06-30: 40 mg via INTRAVENOUS
  Filled 2017-06-30: qty 40

## 2017-06-30 MED ORDER — CYCLOBENZAPRINE HCL 10 MG PO TABS
ORAL_TABLET | ORAL | Status: AC
  Filled 2017-06-30: qty 1

## 2017-06-30 MED ORDER — LABETALOL HCL 5 MG/ML IV SOLN
10.0000 mg | Freq: Once | INTRAVENOUS | Status: AC
  Administered 2017-06-30: 10 mg via INTRAVENOUS

## 2017-06-30 MED ORDER — DEXAMETHASONE SODIUM PHOSPHATE 10 MG/ML IJ SOLN
INTRAMUSCULAR | Status: AC
  Filled 2017-06-30: qty 1

## 2017-06-30 MED ORDER — ONDANSETRON 4 MG PO TBDP: 4.0000 mg | ORAL_TABLET | Freq: Three times a day (TID) | ORAL | Status: DC | PRN

## 2017-06-30 MED ORDER — SUGAMMADEX SODIUM 200 MG/2ML IV SOLN
INTRAVENOUS | Status: DC | PRN
  Administered 2017-06-30: 400 mg via INTRAVENOUS

## 2017-06-30 MED ORDER — DEXAMETHASONE SODIUM PHOSPHATE 10 MG/ML IJ SOLN
INTRAMUSCULAR | Status: DC | PRN
  Administered 2017-06-30: 5 mg via INTRAVENOUS

## 2017-06-30 MED ORDER — SODIUM CHLORIDE 0.9 % IV SOLN
INTRAVENOUS | Status: DC
Start: 2017-06-30 — End: 2017-07-01
  Administered 2017-06-30: 02:00:00 via INTRAVENOUS
  Administered 2017-06-30: 125 mL/h via INTRAVENOUS

## 2017-06-30 MED ORDER — HYDROCODONE-ACETAMINOPHEN 5-325 MG PO TABS
2.0000 | ORAL_TABLET | ORAL | Status: DC | PRN
  Administered 2017-06-30: 2 via ORAL
  Filled 2017-06-30: qty 2

## 2017-06-30 MED ORDER — ROCURONIUM BROMIDE 50 MG/5ML IV SOLN
INTRAVENOUS | Status: AC
  Filled 2017-06-30: qty 1

## 2017-06-30 MED ORDER — SODIUM CHLORIDE 0.9% FLUSH: 3.0000 mL | INTRAVENOUS | Status: DC | PRN

## 2017-06-30 MED ORDER — LACTATED RINGERS IV SOLN
INTRAVENOUS | Status: DC
  Administered 2017-06-30: 14:00:00 via INTRAVENOUS

## 2017-06-30 MED ORDER — ACETAMINOPHEN 325 MG PO TABS: 650.0000 mg | ORAL_TABLET | ORAL | Status: DC | PRN

## 2017-06-30 MED ORDER — ACETAMINOPHEN 325 MG PO TABS: 650.0000 mg | ORAL_TABLET | Freq: Four times a day (QID) | ORAL | Status: DC | PRN

## 2017-06-30 MED ORDER — FENTANYL CITRATE (PF) 250 MCG/5ML IJ SOLN
INTRAMUSCULAR | Status: AC
  Filled 2017-06-30: qty 5

## 2017-06-30 MED ORDER — ALUM & MAG HYDROXIDE-SIMETH 200-200-20 MG/5ML PO SUSP: 30.0000 mL | Freq: Four times a day (QID) | ORAL | Status: DC | PRN

## 2017-06-30 MED ORDER — AMLODIPINE BESYLATE 5 MG PO TABS
5.0000 mg | ORAL_TABLET | Freq: Every day | ORAL | Status: DC
  Administered 2017-06-30 – 2017-07-01 (×2): 5 mg via ORAL
  Filled 2017-06-30 (×2): qty 1

## 2017-06-30 MED ORDER — SENNOSIDES-DOCUSATE SODIUM 8.6-50 MG PO TABS: 1.0000 | ORAL_TABLET | Freq: Every evening | ORAL | Status: DC | PRN

## 2017-06-30 MED ORDER — SODIUM CHLORIDE 0.9% FLUSH
3.0000 mL | Freq: Two times a day (BID) | INTRAVENOUS | Status: DC
  Administered 2017-06-30: 3 mL via INTRAVENOUS

## 2017-06-30 MED ORDER — PIPERACILLIN-TAZOBACTAM 3.375 G IVPB 30 MIN
3.3750 g | Freq: Once | INTRAVENOUS | Status: AC
  Administered 2017-06-30: 3.375 g via INTRAVENOUS
  Filled 2017-06-30: qty 50

## 2017-06-30 MED ORDER — MIDAZOLAM HCL 2 MG/2ML IJ SOLN
INTRAMUSCULAR | Status: AC
  Filled 2017-06-30: qty 2

## 2017-06-30 MED ORDER — ROCURONIUM BROMIDE 100 MG/10ML IV SOLN
INTRAVENOUS | Status: DC | PRN
  Administered 2017-06-30: 50 mg via INTRAVENOUS

## 2017-06-30 MED ORDER — SUCCINYLCHOLINE CHLORIDE 20 MG/ML IJ SOLN
INTRAMUSCULAR | Status: DC | PRN
  Administered 2017-06-30: 200 mg via INTRAVENOUS

## 2017-06-30 MED ORDER — HYDROMORPHONE HCL 1 MG/ML IJ SOLN
INTRAMUSCULAR | Status: AC
  Filled 2017-06-30: qty 0.5

## 2017-06-30 MED ORDER — HYDROMORPHONE HCL 1 MG/ML IJ SOLN
1.0000 mg | Freq: Once | INTRAMUSCULAR | Status: AC
  Administered 2017-06-30: 1 mg via INTRAVENOUS
  Filled 2017-06-30: qty 1

## 2017-06-30 MED ORDER — VANCOMYCIN HCL IN DEXTROSE 1-5 GM/200ML-% IV SOLN
1000.0000 mg | Freq: Once | INTRAVENOUS | Status: AC
  Administered 2017-06-30: 1000 mg via INTRAVENOUS
  Filled 2017-06-30: qty 200

## 2017-06-30 MED ORDER — PHENOL 1.4 % MT LIQD: 1.0000 | OROMUCOSAL | Status: DC | PRN

## 2017-06-30 MED ORDER — HYDRALAZINE HCL 20 MG/ML IJ SOLN: 5.0000 mg | Freq: Three times a day (TID) | INTRAMUSCULAR | Status: DC | PRN

## 2017-06-30 MED ORDER — BENAZEPRIL HCL 40 MG PO TABS
40.0000 mg | ORAL_TABLET | Freq: Every day | ORAL | Status: DC
  Administered 2017-07-01: 40 mg via ORAL
  Filled 2017-06-30 (×2): qty 1

## 2017-06-30 MED ORDER — ONDANSETRON HCL 4 MG PO TABS: 4.0000 mg | ORAL_TABLET | Freq: Four times a day (QID) | ORAL | Status: DC | PRN

## 2017-06-30 MED ORDER — ONDANSETRON HCL 4 MG/2ML IJ SOLN: 4.0000 mg | Freq: Four times a day (QID) | INTRAMUSCULAR | Status: AC | PRN

## 2017-06-30 MED ORDER — GADOBENATE DIMEGLUMINE 529 MG/ML IV SOLN
20.0000 mL | Freq: Once | INTRAVENOUS | Status: AC
  Administered 2017-06-30: 20 mL via INTRAVENOUS

## 2017-06-30 MED ORDER — FENTANYL CITRATE (PF) 100 MCG/2ML IJ SOLN
INTRAMUSCULAR | Status: DC | PRN
  Administered 2017-06-30 (×2): 50 ug via INTRAVENOUS
  Administered 2017-06-30: 150 ug via INTRAVENOUS
  Administered 2017-06-30: 50 ug via INTRAVENOUS

## 2017-06-30 MED ORDER — THROMBIN 5000 UNITS EX SOLR
CUTANEOUS | Status: AC
  Filled 2017-06-30: qty 10000

## 2017-06-30 MED ORDER — VANCOMYCIN HCL 10 G IV SOLR
1500.0000 mg | Freq: Three times a day (TID) | INTRAVENOUS | Status: DC
  Administered 2017-06-30 – 2017-07-01 (×3): 1500 mg via INTRAVENOUS
  Filled 2017-06-30 (×5): qty 1500

## 2017-06-30 MED ORDER — NICOTINE POLACRILEX 2 MG MT GUM
2.0000 mg | CHEWING_GUM | OROMUCOSAL | Status: DC | PRN
  Administered 2017-06-30 – 2017-07-01 (×2): 2 mg via ORAL
  Filled 2017-06-30 (×4): qty 1

## 2017-06-30 MED ORDER — BUPIVACAINE HCL (PF) 0.25 % IJ SOLN
INTRAMUSCULAR | Status: AC
  Filled 2017-06-30: qty 30

## 2017-06-30 MED ORDER — HYDROMORPHONE HCL 1 MG/ML IJ SOLN
0.5000 mg | Freq: Once | INTRAMUSCULAR | Status: AC
  Administered 2017-06-30: 0.5 mg via INTRAVENOUS
  Filled 2017-06-30: qty 0.5

## 2017-06-30 MED ORDER — THROMBIN 5000 UNITS EX SOLR
CUTANEOUS | Status: DC | PRN
  Administered 2017-06-30 (×2): 5000 [IU] via TOPICAL

## 2017-06-30 MED ORDER — LIDOCAINE HCL (CARDIAC) 20 MG/ML IV SOLN
INTRAVENOUS | Status: AC
  Filled 2017-06-30: qty 5

## 2017-06-30 MED ORDER — PROPOFOL 10 MG/ML IV BOLUS
INTRAVENOUS | Status: AC
  Filled 2017-06-30: qty 20

## 2017-06-30 MED ORDER — CYCLOBENZAPRINE HCL 10 MG PO TABS
10.0000 mg | ORAL_TABLET | Freq: Three times a day (TID) | ORAL | Status: DC | PRN
Start: 2017-06-30 — End: 2017-07-01
  Administered 2017-07-01: 10 mg via ORAL
  Filled 2017-06-30: qty 1

## 2017-06-30 MED ORDER — MUPIROCIN 2 % EX OINT
TOPICAL_OINTMENT | CUTANEOUS | Status: AC
  Administered 2017-06-30: 1 via TOPICAL
  Filled 2017-06-30: qty 22

## 2017-06-30 MED ORDER — MENTHOL 3 MG MT LOZG: 1.0000 | LOZENGE | OROMUCOSAL | Status: DC | PRN

## 2017-06-30 MED ORDER — OXYCODONE HCL 5 MG PO TABS
5.0000 mg | ORAL_TABLET | ORAL | Status: DC | PRN
  Administered 2017-07-01: 10 mg via ORAL
  Filled 2017-06-30: qty 2

## 2017-06-30 MED ORDER — ACETAMINOPHEN 650 MG RE SUPP: 650.0000 mg | Freq: Four times a day (QID) | RECTAL | Status: DC | PRN

## 2017-06-30 MED ORDER — PHENYLEPHRINE HCL 10 MG/ML IJ SOLN
INTRAMUSCULAR | Status: DC | PRN
  Administered 2017-06-30 (×3): 80 ug via INTRAVENOUS

## 2017-06-30 MED ORDER — LIDOCAINE-EPINEPHRINE 1 %-1:100000 IJ SOLN
INTRAMUSCULAR | Status: AC
  Filled 2017-06-30: qty 1

## 2017-06-30 MED ORDER — HEMOSTATIC AGENTS (NO CHARGE) OPTIME
TOPICAL | Status: DC | PRN
  Administered 2017-06-30: 1 via TOPICAL

## 2017-06-30 MED ORDER — LABETALOL HCL 5 MG/ML IV SOLN
INTRAVENOUS | Status: AC
  Filled 2017-06-30: qty 4

## 2017-06-30 MED ORDER — OXYCODONE-ACETAMINOPHEN 5-325 MG PO TABS
1.0000 | ORAL_TABLET | Freq: Four times a day (QID) | ORAL | Status: DC | PRN
  Administered 2017-06-30: 2 via ORAL

## 2017-06-30 MED ORDER — LIDOCAINE-EPINEPHRINE 1 %-1:100000 IJ SOLN
INTRAMUSCULAR | Status: DC | PRN
  Administered 2017-06-30: 10 mL

## 2017-06-30 MED ORDER — SODIUM CHLORIDE 0.9 % IV BOLUS (SEPSIS)
1000.0000 mL | Freq: Once | INTRAVENOUS | Status: AC
  Administered 2017-06-30: 1000 mL via INTRAVENOUS

## 2017-06-30 MED ORDER — HYDROMORPHONE HCL 1 MG/ML IJ SOLN
1.0000 mg | INTRAMUSCULAR | Status: AC | PRN
  Administered 2017-06-30 (×2): 1 mg via INTRAVENOUS
  Filled 2017-06-30 (×2): qty 1

## 2017-06-30 MED ORDER — ONDANSETRON HCL 4 MG/2ML IJ SOLN: 4.0000 mg | Freq: Four times a day (QID) | INTRAMUSCULAR | Status: DC | PRN

## 2017-06-30 MED ORDER — ACETAMINOPHEN 650 MG RE SUPP: 650.0000 mg | RECTAL | Status: DC | PRN

## 2017-06-30 MED ORDER — HYDROMORPHONE HCL 1 MG/ML IJ SOLN
0.2500 mg | INTRAMUSCULAR | Status: DC | PRN
  Administered 2017-06-30: 0.5 mg via INTRAVENOUS

## 2017-06-30 MED ORDER — MUPIROCIN 2 % EX OINT
1.0000 "application " | TOPICAL_OINTMENT | Freq: Once | CUTANEOUS | Status: AC
  Administered 2017-06-30: 1 via TOPICAL

## 2017-06-30 MED ORDER — ONDANSETRON HCL 4 MG/2ML IJ SOLN
INTRAMUSCULAR | Status: DC | PRN
  Administered 2017-06-30: 4 mg via INTRAVENOUS

## 2017-06-30 MED ORDER — HYDROMORPHONE HCL 1 MG/ML IJ SOLN: 0.5000 mg | INTRAMUSCULAR | Status: DC | PRN

## 2017-06-30 MED ORDER — SODIUM CHLORIDE 0.9 % IR SOLN
Status: DC | PRN
  Administered 2017-06-30: 17:00:00

## 2017-06-30 MED ORDER — PROPOFOL 10 MG/ML IV BOLUS
INTRAVENOUS | Status: DC | PRN
  Administered 2017-06-30: 200 mg via INTRAVENOUS

## 2017-06-30 MED ORDER — OXYCODONE-ACETAMINOPHEN 5-325 MG PO TABS
ORAL_TABLET | ORAL | Status: AC
  Filled 2017-06-30: qty 2

## 2017-06-30 MED ORDER — SODIUM CHLORIDE 0.9 % IV SOLN
250.0000 mL | INTRAVENOUS | Status: DC
  Administered 2017-06-30: 250 mL via INTRAVENOUS

## 2017-06-30 MED ORDER — PANTOPRAZOLE SODIUM 40 MG PO TBEC
40.0000 mg | DELAYED_RELEASE_TABLET | Freq: Every day | ORAL | Status: DC
  Administered 2017-06-30: 40 mg via ORAL
  Filled 2017-06-30: qty 1

## 2017-06-30 MED ORDER — MELOXICAM 7.5 MG PO TABS
7.5000 mg | ORAL_TABLET | Freq: Every day | ORAL | Status: DC
  Administered 2017-07-01: 7.5 mg via ORAL
  Filled 2017-06-30 (×2): qty 1

## 2017-06-30 MED ORDER — ONDANSETRON HCL 4 MG/2ML IJ SOLN
INTRAMUSCULAR | Status: AC
  Filled 2017-06-30: qty 2

## 2017-06-30 MED ORDER — LIDOCAINE HCL (CARDIAC) 20 MG/ML IV SOLN
INTRAVENOUS | Status: DC | PRN
  Administered 2017-06-30: 100 mg via INTRAVENOUS

## 2017-06-30 MED ORDER — PIPERACILLIN-TAZOBACTAM 3.375 G IVPB
3.3750 g | Freq: Three times a day (TID) | INTRAVENOUS | Status: DC
Start: 2017-06-30 — End: 2017-06-30
  Administered 2017-06-30: 3.375 g via INTRAVENOUS
  Filled 2017-06-30 (×3): qty 50

## 2017-06-30 MED ORDER — CYCLOBENZAPRINE HCL 10 MG PO TABS
10.0000 mg | ORAL_TABLET | Freq: Three times a day (TID) | ORAL | Status: DC | PRN
  Administered 2017-06-30: 10 mg via ORAL

## 2017-06-30 MED ORDER — SUGAMMADEX SODIUM 500 MG/5ML IV SOLN
INTRAVENOUS | Status: AC
  Filled 2017-06-30: qty 5

## 2017-06-30 MED ORDER — 0.9 % SODIUM CHLORIDE (POUR BTL) OPTIME
TOPICAL | Status: DC | PRN
  Administered 2017-06-30: 1000 mL

## 2017-06-30 MED ORDER — PROMETHAZINE HCL 25 MG/ML IJ SOLN: 6.2500 mg | INTRAMUSCULAR | Status: DC | PRN

## 2017-06-30 MED ORDER — BISACODYL 10 MG RE SUPP: 10.0000 mg | Freq: Every day | RECTAL | Status: DC | PRN

## 2017-06-30 SURGICAL SUPPLY — 75 items
ADH SKN CLS APL DERMABOND .7 (GAUZE/BANDAGES/DRESSINGS) ×1
APL SKNCLS STERI-STRIP NONHPOA (GAUZE/BANDAGES/DRESSINGS) ×1
BAG DECANTER FOR FLEXI CONT (MISCELLANEOUS) ×3 IMPLANT
BENZOIN TINCTURE PRP APPL 2/3 (GAUZE/BANDAGES/DRESSINGS) ×3 IMPLANT
BLADE CLIPPER SURG (BLADE) IMPLANT
CANISTER SUCT 3000ML PPV (MISCELLANEOUS) ×3 IMPLANT
CARTRIDGE OIL MAESTRO DRILL (MISCELLANEOUS) ×1 IMPLANT
CLOSURE WOUND 1/2 X4 (GAUZE/BANDAGES/DRESSINGS) ×1
CORD BIPOLAR FORCEPS 12FT (ELECTRODE) ×3 IMPLANT
DECANTER SPIKE VIAL GLASS SM (MISCELLANEOUS) ×3 IMPLANT
DERMABOND ADVANCED (GAUZE/BANDAGES/DRESSINGS) ×2
DERMABOND ADVANCED .7 DNX12 (GAUZE/BANDAGES/DRESSINGS) ×1 IMPLANT
DIFFUSER DRILL AIR PNEUMATIC (MISCELLANEOUS) ×1 IMPLANT
DRAPE LAPAROTOMY 100X72X124 (DRAPES) ×3 IMPLANT
DRAPE POUCH INSTRU U-SHP 10X18 (DRAPES) ×3 IMPLANT
DRAPE SURG 17X23 STRL (DRAPES) ×2 IMPLANT
DRSG OPSITE 4X5.5 SM (GAUZE/BANDAGES/DRESSINGS) ×2 IMPLANT
DRSG OPSITE POSTOP 4X6 (GAUZE/BANDAGES/DRESSINGS) ×2 IMPLANT
ELECT REM PT RETURN 9FT ADLT (ELECTROSURGICAL) ×3
ELECTRODE REM PT RTRN 9FT ADLT (ELECTROSURGICAL) ×1 IMPLANT
EVACUATOR 1/8 PVC DRAIN (DRAIN) IMPLANT
GAUZE SPONGE 4X4 12PLY STRL (GAUZE/BANDAGES/DRESSINGS) ×1 IMPLANT
GAUZE SPONGE 4X4 12PLY STRL LF (GAUZE/BANDAGES/DRESSINGS) ×2 IMPLANT
GAUZE SPONGE 4X4 16PLY XRAY LF (GAUZE/BANDAGES/DRESSINGS) IMPLANT
GLOVE BIO SURGEON STRL SZ 6.5 (GLOVE) IMPLANT
GLOVE BIO SURGEON STRL SZ7 (GLOVE) IMPLANT
GLOVE BIO SURGEON STRL SZ7.5 (GLOVE) IMPLANT
GLOVE BIO SURGEON STRL SZ8 (GLOVE) ×3 IMPLANT
GLOVE BIO SURGEON STRL SZ8.5 (GLOVE) IMPLANT
GLOVE BIO SURGEONS STRL SZ 6.5 (GLOVE)
GLOVE BIOGEL M 8.0 STRL (GLOVE) IMPLANT
GLOVE BIOGEL PI IND STRL 7.0 (GLOVE) IMPLANT
GLOVE BIOGEL PI IND STRL 7.5 (GLOVE) IMPLANT
GLOVE BIOGEL PI INDICATOR 7.0 (GLOVE)
GLOVE BIOGEL PI INDICATOR 7.5 (GLOVE) ×2
GLOVE ECLIPSE 6.5 STRL STRAW (GLOVE) IMPLANT
GLOVE ECLIPSE 7.0 STRL STRAW (GLOVE) ×2 IMPLANT
GLOVE ECLIPSE 7.5 STRL STRAW (GLOVE) IMPLANT
GLOVE ECLIPSE 8.0 STRL XLNG CF (GLOVE) IMPLANT
GLOVE ECLIPSE 8.5 STRL (GLOVE) IMPLANT
GLOVE EXAM NITRILE LRG STRL (GLOVE) IMPLANT
GLOVE EXAM NITRILE XL STR (GLOVE) IMPLANT
GLOVE EXAM NITRILE XS STR PU (GLOVE) IMPLANT
GLOVE INDICATOR 6.5 STRL GRN (GLOVE) IMPLANT
GLOVE INDICATOR 7.0 STRL GRN (GLOVE) IMPLANT
GLOVE INDICATOR 7.5 STRL GRN (GLOVE) IMPLANT
GLOVE INDICATOR 8.0 STRL GRN (GLOVE) IMPLANT
GLOVE INDICATOR 8.5 STRL (GLOVE) ×3 IMPLANT
GLOVE OPTIFIT SS 8.0 STRL (GLOVE) IMPLANT
GLOVE SURG SS PI 6.5 STRL IVOR (GLOVE) IMPLANT
GOWN STRL REUS W/ TWL LRG LVL3 (GOWN DISPOSABLE) ×1 IMPLANT
GOWN STRL REUS W/ TWL XL LVL3 (GOWN DISPOSABLE) ×1 IMPLANT
GOWN STRL REUS W/TWL 2XL LVL3 (GOWN DISPOSABLE) ×3 IMPLANT
GOWN STRL REUS W/TWL LRG LVL3 (GOWN DISPOSABLE)
GOWN STRL REUS W/TWL XL LVL3 (GOWN DISPOSABLE) ×3
KIT BASIN OR (CUSTOM PROCEDURE TRAY) ×3 IMPLANT
KIT ROOM TURNOVER OR (KITS) ×3 IMPLANT
NDL HYPO 25X1 1.5 SAFETY (NEEDLE) IMPLANT
NEEDLE HYPO 25X1 1.5 SAFETY (NEEDLE) ×3 IMPLANT
NS IRRIG 1000ML POUR BTL (IV SOLUTION) ×3 IMPLANT
OIL CARTRIDGE MAESTRO DRILL (MISCELLANEOUS)
PACK LAMINECTOMY NEURO (CUSTOM PROCEDURE TRAY) ×3 IMPLANT
PATTIES SURGICAL .75X.75 (GAUZE/BANDAGES/DRESSINGS) IMPLANT
SPONGE SURGIFOAM ABS GEL SZ50 (HEMOSTASIS) ×3 IMPLANT
STRIP CLOSURE SKIN 1/2X4 (GAUZE/BANDAGES/DRESSINGS) ×2 IMPLANT
SUT VIC AB 0 CT1 18XCR BRD8 (SUTURE) ×1 IMPLANT
SUT VIC AB 0 CT1 8-18 (SUTURE) ×3
SUT VIC AB 2-0 CT1 18 (SUTURE) ×3 IMPLANT
SUT VICRYL 4-0 PS2 18IN ABS (SUTURE) ×3 IMPLANT
SWAB COLLECTION DEVICE MRSA (MISCELLANEOUS) ×1 IMPLANT
SWAB CULTURE ESWAB REG 1ML (MISCELLANEOUS) ×1 IMPLANT
SYR CONTROL 10ML LL (SYRINGE) IMPLANT
TOWEL GREEN STERILE (TOWEL DISPOSABLE) ×3 IMPLANT
TOWEL GREEN STERILE FF (TOWEL DISPOSABLE) ×3 IMPLANT
WATER STERILE IRR 1000ML POUR (IV SOLUTION) ×3 IMPLANT

## 2017-06-30 NOTE — Anesthesia Postprocedure Evaluation (Signed)
Anesthesia Post Note  Patient: Nicholas Choi  Procedure(s) Performed: Procedure(s) (LRB): LUMBAR WOUND DEBRIDEMENT OF RECURRENT DISC LEFT LUMBAR FOUR-FIVE (N/A)     Patient location during evaluation: PACU Anesthesia Type: General Level of consciousness: awake and alert Pain management: pain level controlled Vital Signs Assessment: post-procedure vital signs reviewed and stable Respiratory status: spontaneous breathing, nonlabored ventilation and respiratory function stable Cardiovascular status: blood pressure returned to baseline and stable Postop Assessment: no signs of nausea or vomiting Anesthetic complications: no    Last Vitals:  Vitals:   06/30/17 1946 06/30/17 1950  BP: (!) 163/98 (!) 144/78  Pulse: 89   Resp: 15 16  Temp: 36.8 C 36.8 C    Last Pain:  Vitals:   06/30/17 1950  TempSrc:   PainSc: 4                  Shiryl Ruddy,W. EDMOND

## 2017-06-30 NOTE — ED Provider Notes (Signed)
TIME SEEN: 1:35 AM  By signing my name below, I, Cynda Acres, attest that this documentation has been prepared under the direction and in the presence of Aksh Swart, Layla Maw, DO. Electronically Signed: Cynda Acres, Scribe. 06/30/17. 1:45 AM.  CHIEF COMPLAINT: Left leg pain   HPI:  Nicholas Choi is a 36 y.o. male with a history of hypertension, who presents to the Emergency Department by ambulance, complaining of sudden-onset, constant left leg pain that began one week ago. Patient reports developing back pain with sharp/shooting radiation down the left leg week and a half ago when he bent over and it worsened two days ago. Patient reports having back surgery on June 14th with Dr. Wynetta Emery, he reports injuring his back one week after his surgery in which he did follow up with his surgeon. States he was put on a steroid taper which he has been taking. Patient reports associated left foot tingling on the bottom of his foot that started several days ago. Also has had chills. Patient reports taking a percocet and prednisone with some relief. Reports he did take 2 Percocet around 8:30 PM tonight. Patient states his pain is worse with ambulation or movement, nothing improves his pain. Patient was not ambulatory in the emergency department, he was brought in by EMS as he was unable to ambulate at home. Patient denies any loss of bowel/bladder control, urinary retention, fever, cough, sore throat, headache, diarrhea, vomiting, rash, focal weakness, or any additional symptoms. Patient's wife does report that his daughter had mono a month ago. Patient denies any other sick contacts or recent travel. No tick bites. No rash.  ROS: See HPI Constitutional: no fever  Eyes: no drainage  ENT: no runny nose   Cardiovascular:  no chest pain  Resp: no SOB  GI: no vomiting GU: no dysuria Integumentary: no rash  Allergy: no hives  Musculoskeletal: no leg swelling  Neurological: no slurred speech ROS otherwise  negative  PAST MEDICAL HISTORY/PAST SURGICAL HISTORY:  Past Medical History:  Diagnosis Date  . Hypertension     MEDICATIONS:  Prior to Admission medications   Medication Sig Start Date End Date Taking? Authorizing Provider  acetaminophen (TYLENOL) 500 MG tablet Take 1,000-2,000 mg by mouth every 6 (six) hours as needed for mild pain, moderate pain or headache.    [provider]  clindamycin (CLEOCIN) 300 MG capsule Take 1 capsule (300 mg total) by mouth 3 (three) times daily. Patient not taking: Reported on 08/12/2015 03/24/15   Linna Hoff, MD  famotidine (PEPCID) 20 MG tablet Take 20 mg by mouth daily as needed for heartburn or indigestion.    [provider]  fluticasone (FLONASE) 50 MCG/ACT nasal spray Place 1 spray into both nostrils 2 (two) times daily. Patient not taking: Reported on 08/12/2015 03/24/15   Linna Hoff, MD  hydrochlorothiazide (HYDRODIURIL) 25 MG tablet Take 1 tablet (25 mg total) by mouth daily. 03/24/15   Linna Hoff, MD  HYDROcodone-acetaminophen (NORCO/VICODIN) 5-325 MG tablet Take 2 tablets by mouth every 4 (four) hours as needed. 03/08/17   Elson Areas, PA-C  ipratropium (ATROVENT) 0.06 % nasal spray Place 2 sprays into the nose 4 (four) times daily. Patient not taking: Reported on 08/12/2015 11/02/13   Linna Hoff, MD  meloxicam (MOBIC) 7.5 MG tablet Take 1 tablet (7.5 mg total) by mouth daily. 03/08/17   Elson Areas, PA-C  metoCLOPramide (REGLAN) 10 MG tablet Take 1 tablet (10 mg total) by mouth every 6 (  six) hours. Patient not taking: Reported on 08/12/2015 06/04/13   Dione BoozeGlick, David, MD  ondansetron (ZOFRAN ODT) 4 MG disintegrating tablet Take 1 tablet (4 mg total) by mouth every 8 (eight) hours as needed for nausea or vomiting. 08/12/15   Teressa LowerPickering, Vrinda, NP  oxyCODONE-acetaminophen (PERCOCET/ROXICET) 5-325 MG per tablet Take 1-2 tablets by mouth every 6 (six) hours as needed for severe pain. 08/12/15   Teressa LowerPickering, Vrinda, NP   sulfamethoxazole-trimethoprim (BACTRIM,SEPTRA) 200-40 MG/5ML suspension Take 10 mLs by mouth 2 (two) times daily. 03/08/17   Elson AreasSofia, Leslie K, PA-C  tamsulosin (FLOMAX) 0.4 MG CAPS capsule Take 1 capsule (0.4 mg total) by mouth daily. 08/12/15   Teressa LowerPickering, Vrinda, NP    ALLERGIES:  No Known Allergies  SOCIAL HISTORY:  Social History  Substance Use Topics  . Smoking status: Current Every Day Smoker    Packs/day: 1.00  . Smokeless tobacco: Former NeurosurgeonUser  . Alcohol use No    FAMILY HISTORY: No family history on file.  EXAM: BP (!) 158/110 (BP Location: Right Arm)   Pulse (!) 128   Temp 100 F (37.8 C) (Oral)   Resp 18   Ht 6\' 5"  (1.956 m)   Wt (!) 345 lb (156.5 kg)   SpO2 98%   BMI 40.91 kg/m  CONSTITUTIONAL: Alert and oriented and responds appropriately to questions. Appears uncomfortable, skin is warm to touch HEAD: Normocephalic EYES: Conjunctivae clear, pupils appear equal, EOMI ENT: normal nose; moist mucous membranes; No pharyngeal erythema or petechiae, no tonsillar hypertrophy or exudate, no uvular deviation, no unilateral swelling, no trismus or drooling, no muffled voice, normal phonation, no stridor, no dental caries present, no drainable dental abscess noted, no Ludwig's angina, tongue sits flat in the bottom of the mouth, no angioedema, no facial erythema or warmth, no facial swelling; no pain with movement of the neck. NECK: Supple, no meningismus, no nuchal rigidity, no LAD  CARD: Regular and tachycardic; S1 and S2 appreciated; no murmurs, no clicks, no rubs, no gallops RESP: Normal chest excursion without splinting or tachypnea; breath sounds clear and equal bilaterally; no wheezes, no rhonchi, no rales, no hypoxia or respiratory distress, speaking full sentences ABD/GI: Normal bowel sounds; non-distended; soft, non-tender, no rebound, no guarding, no peritoneal signs, no hepatosplenomegaly BACK:  The back appears normal and is mildly tender over the lower lumbar  spine, he has a central lumbar surgical scar that is well-healed without surrounding erythema, warmth, fluctuance or induration, there is no CVA tenderness EXT: Normal ROM in all joints; non-tender to palpation; no edema; normal capillary refill; no cyanosis, no calf tenderness or swelling    SKIN: Normal color for age and race; warm; no rash NEURO: Moves all extremities equally, strength 5/5 in all 4 extremities, sensation to light touch intact diffusely, cranial nerves II-12 intact, normal speech, 2+ deep tendon reflexes in bilateral upper and lower extremity's, no clonus PSYCH: The patient's mood and manner are appropriate. Grooming and personal hygiene are appropriate.  MEDICAL DECISION MAKING: Patient here with low-grade fever but did take Tylenol prior to arrival, tachycardia. Concern for possible sepsis. Complaining of back pain. No other infectious symptoms. Recent surgery with Dr. Wynetta Emeryram on June 14th.  Patient states he thinks it was L5-S1. He denies any hardware was placed in his back. No acute injury other than bending over and feeling a "pop". Neurologically intact here. Will obtain labs, chest x-ray, urine and a MRI of the spine with and without contrast given his fever. We'll give IV fluids, pain  medication. Patient does meet SIRS criteria. Will give broad-spectrum antibiotics.  ED PROGRESS: 3:00 AM  Patient's labs show mild leukocytosis with left shift. Lactate is normal. Urine shows no sign of infection. Chest x-ray clear. MRI pending.   5:30 AM  Pt's MRI shows sequela of a left laminectomy and discectomy at L4-L5. He has residual versus recurrent left L4-L5 disc extrusion impinging on the left L5 nerve root and slightly worse in severe canal stenosis at this level. There are small collections along the midline incision site that could be postoperative seromas but infection cannot be ruled out. He does not have any obvious redness, warmth, fluctuance or any drainage over this incision site  but I do not have any other source of infection today. Patient has received 3 mg of IV Dilaudid and is still unable to sit upright or stand. I feel he may need admission for pain control and have his neurosurgeon see him. We'll discuss with neurosurgery on call for further recommendations.   5:40 AM  Spoke with Dr. Danielle Dess with neurosurgery. Appreciate his help. He will see the patient in consult and also discussed with Dr. Wynetta Emery, patient's neurosurgeon. He agrees that patient will likely need to be reexplored and likely have this disc removed again. He agrees with holding off on IV steroids given patient had low-grade fever. We'll discuss with medicine for admission. Patient's PCP is Dr. Waynard Edwards.  Patient remains NPO at this time with IV fluids. He is still hemodynamically stable. No new neurologic deficits other than the tingling in the bottom of the left foot.  6:06 AM Discussed patient's case with hospitalist, Dr. Antionette Char.  I have recommended admission and patient (and family if present) agree with this plan. Admitting physician will place admission orders.   I reviewed all nursing notes, vitals, pertinent previous records, EKGs, lab and urine results, imaging (as available).     EKG Interpretation  Date/Time:  Monday June 30 2017 01:03:41 EDT Ventricular Rate:  113 PR Interval:    QRS Duration: 92 QT Interval:  317 QTC Calculation: 435 R Axis:   4 Text Interpretation:  Sinus tachycardia No significant change since last tracing Confirmed by Rochele Raring [phone]) on 06/30/2017 1:32:53 AM        I personally performed the services described in this documentation, which was scribed in my presence. The recorded information has been reviewed and is accurate.     Makai Agostinelli, Layla Maw, DO 06/30/17 [phone]

## 2017-06-30 NOTE — Progress Notes (Signed)
Pharmacy Antibiotic Note  Nicholas HawthorneRonald C Choi is a 36 y.o. male admitted on 06/30/2017 with possible spinal abscess.  Pharmacy has been consulted for vancomycin and zosyn dosing. Pt is afebrile, WBC's reported at 12.4, normalized CrCl approx 100.  Zosyn 30 min infusion and Vancomycin 1 g given at 0225 due to length of time since load, we will start maintenance dosing.  Plan: Vancomycin 1500 mg IV Q8h  Goal trough 15-20 mcg/mL.  Zosyn 3.375 g IV q8h (4 hour infusion). Check Vancomycin trough at steady state Follow Scr, C/x, renal function, and clinical status  Height: 6\' 5"  (195.6 cm) Weight: (!) 345 lb (156.5 kg) IBW/kg (Calculated) : 89.1  Temp (24hrs), Avg:99.5 F (37.5 C), Min:98.7 F (37.1 C), Max:100 F (37.8 C)   Recent Labs Lab 06/30/17 0116 06/30/17 0142  WBC 12.4*  --   CREATININE 0.91  --   LATICACIDVEN  --  1.89    Estimated Creatinine Clearance: 182.5 mL/min (by C-G formula based on SCr of 0.91 mg/dL).    No Known Allergies  Antimicrobials this admission: Vancomycin 07/09 >>  Zosyn 07/09 >>   Dose adjustments this admission:   Microbiology results:   Nicholas SoAustin J LucasPharmD PGY1 Pharmacy Practice Resident 06/30/2017 9:08 AM Pager: [phone]

## 2017-06-30 NOTE — ED Notes (Signed)
ED Provider at bedside. 

## 2017-06-30 NOTE — H&P (Signed)
History and Physical    Nicholas Choi ZOX:[id number] DOB: [birth year] DOA: 06/30/2017   PCP: Rodrigo Ran, MD   Patient coming from:  Home    Chief Complaint: Back pain   HPI: Nicholas Choi is a 36 y.o. male with medical history significant for HTN, GERD, and recent laminectomy on 6/14 presenting to the ED with sudden onset, 1 week history of constant left leg and gluteal pain after picking up an object from the floor and hearing a "pop". This is accompanied by L foot tingling and numbness. Patient cannot ambulate more than a few steps without having severe pain. He reports less pain at rest, but still present. This was initially followed as OP, and steroid tapers and narcotic pain meds were initiated, without significant improvement. Denies fevers, but did report chills. Denies any respiratory complaints. Denies any chest pain or palpitations. Denies lower extremity swelling. Denies nausea, heartburn or change in bowel habits. Denies abdominal pain. Appetite is normal. Denies any dysuria or decreased sensation when urinating or urine retention. Denies abnormal skin rashes. Denies any bleeding issues such as epistaxis, hematemesis, hematuria or hematochezia.          ED Course:  BP 140/90   Pulse 89   Temp 98.7 F (37.1 C) (Oral)   Resp 11   Ht 6\' 5"  (1.956 m)   Wt (!) 156.5 kg (345 lb)   SpO2 96%   BMI 40.91 kg/m   Placed NPO IV Zosyn and Vanc were initiated IV Dilaudid given with minimal  Steroids were held due to possibility of infection   WBC 12 Lactic acid 1.89 PT INR 13.5/1.03 EKG ST without any ACS changes  CXR NAD MRI shows sequelae of L4-L5 with disc extrusion impinging on L5 root and slightly worse in severe canal stenosis at that level Case discussed with Neurosurgery, Dr. Danielle Dess and Dr. Wynetta Emery, who recommended that the patient go back to OR for discectomy.   Review of Systems:  As per HPI otherwise all other systems reviewed and are negative  Past Medical History:    Diagnosis Date  . Hypertension     Past Surgical History:  Procedure Laterality Date  . BACK SURGERY    . MOUTH SURGERY      Social History Social History   Social History  . Marital status: Married    Spouse name: N/A  . Number of children: N/A  . Years of education: N/A   Occupational History  . Not on file.   Social History Main Topics  . Smoking status: Current Every Day Smoker    Packs/day: 1.00  . Smokeless tobacco: Former Neurosurgeon  . Alcohol use No  . Drug use: No  . Sexual activity: Yes   Other Topics Concern  . Not on file   Social History Narrative  . No narrative on file     No Known Allergies  History reviewed. No pertinent family history.    Prior to Admission medications   Medication Sig Start Date End Date Taking? Authorizing Provider  acetaminophen (TYLENOL) 500 MG tablet Take 1,000-2,000 mg by mouth every 6 (six) hours as needed for mild pain, moderate pain or headache.    [provider]  clindamycin (CLEOCIN) 300 MG capsule Take 1 capsule (300 mg total) by mouth 3 (three) times daily. Patient not taking: Reported on 08/12/2015 03/24/15   Linna Hoff, MD  famotidine (PEPCID) 20 MG tablet Take 20 mg by mouth daily as needed for heartburn or indigestion.  [provider]  fluticasone (FLONASE) 50 MCG/ACT nasal spray Place 1 spray into both nostrils 2 (two) times daily. Patient not taking: Reported on 08/12/2015 03/24/15   Linna HoffKindl, James D, MD  hydrochlorothiazide (HYDRODIURIL) 25 MG tablet Take 1 tablet (25 mg total) by mouth daily. 03/24/15   Linna HoffKindl, James D, MD  HYDROcodone-acetaminophen (NORCO/VICODIN) 5-325 MG tablet Take 2 tablets by mouth every 4 (four) hours as needed. 03/08/17   Elson AreasSofia, Leslie K, PA-C  ipratropium (ATROVENT) 0.06 % nasal spray Place 2 sprays into the nose 4 (four) times daily. Patient not taking: Reported on 08/12/2015 11/02/13   Linna HoffKindl, James D, MD  meloxicam (MOBIC) 7.5 MG tablet Take 1 tablet (7.5 mg total) by  mouth daily. 03/08/17   Elson AreasSofia, Leslie K, PA-C  metoCLOPramide (REGLAN) 10 MG tablet Take 1 tablet (10 mg total) by mouth every 6 (six) hours. Patient not taking: Reported on 08/12/2015 06/04/13   Dione BoozeGlick, David, MD  ondansetron (ZOFRAN ODT) 4 MG disintegrating tablet Take 1 tablet (4 mg total) by mouth every 8 (eight) hours as needed for nausea or vomiting. 08/12/15   Teressa LowerPickering, Vrinda, NP  oxyCODONE-acetaminophen (PERCOCET/ROXICET) 5-325 MG per tablet Take 1-2 tablets by mouth every 6 (six) hours as needed for severe pain. 08/12/15   Teressa LowerPickering, Vrinda, NP  sulfamethoxazole-trimethoprim (BACTRIM,SEPTRA) 200-40 MG/5ML suspension Take 10 mLs by mouth 2 (two) times daily. 03/08/17   Elson AreasSofia, Leslie K, PA-C  tamsulosin (FLOMAX) 0.4 MG CAPS capsule Take 1 capsule (0.4 mg total) by mouth daily. 08/12/15   Teressa LowerPickering, Vrinda, NP    Physical Exam:  Vitals:   06/30/17 0545 06/30/17 0600 06/30/17 0615 06/30/17 0630  BP: (!) 155/101 (!) 142/99 (!) 144/92 140/90  Pulse: (!) 106 90 87 89  Resp: 12 12  11   Temp:      TempSrc:      SpO2: 98% 95% 95% 96%  Weight:      Height:       Constitutional: NAD, calm, uncomfortable due to pain  Eyes: PERRL, lids and conjunctivae normal ENMT: Mucous membranes are moist, without exudate or lesions  Neck: normal, supple, no masses, no thyromegaly Respiratory: clear to auscultation bilaterally, no wheezing, no crackles. Normal respiratory effort  Cardiovascular: Regular rate and rhythm, no murmurs, rubs or gallops. No extremity edema. 2+ pedal pulses. No carotid bruits.  Abdomen: Soft, moderately obese,  non tender, No hepatosplenomegaly. Bowel sounds positive.  Musculoskeletal: no clubbing / cyanosis. Moves all extremities Skin: no jaundice, No lesions.the L5 incision site is non tender, and well healed, no erythema noted. Multiple tattoos seen.  Neurologic: Sensation slightly decreased at the L5   Strength equal in all extremities. Able to move extremities despite the pain    Psychiatric:   Alert and oriented x 3. Normal mood.     Labs on Admission: I have personally reviewed following labs and imaging studies  CBC:  Recent Labs Lab 06/30/17 0116  WBC 12.4*  NEUTROABS 10.7*  HGB 16.3  HCT 48.2  MCV 84.9  PLT 176    Basic Metabolic Panel:  Recent Labs Lab 06/30/17 0116  NA 138  K 4.1  CL 107  CO2 22  GLUCOSE 116*  BUN 11  CREATININE 0.91  CALCIUM 10.9*    GFR: Estimated Creatinine Clearance: 182.5 mL/min (by C-G formula based on SCr of 0.91 mg/dL).  Liver Function Tests:  Recent Labs Lab 06/30/17 0116  AST 19  ALT 12*  ALKPHOS 86  BILITOT 0.8  PROT 7.3  ALBUMIN 4.1  No results for input(s): LIPASE, AMYLASE in the last 168 hours. No results for input(s): AMMONIA in the last 168 hours.  Coagulation Profile:  Recent Labs Lab 06/30/17 0116  INR 1.03    Cardiac Enzymes: No results for input(s): CKTOTAL, CKMB, CKMBINDEX, TROPONINI in the last 168 hours.  BNP (last 3 results) No results for input(s): PROBNP in the last 8760 hours.  HbA1C: No results for input(s): HGBA1C in the last 72 hours.  CBG: No results for input(s): GLUCAP in the last 168 hours.  Lipid Profile: No results for input(s): CHOL, HDL, LDLCALC, TRIG, CHOLHDL, LDLDIRECT in the last 72 hours.  Thyroid Function Tests: No results for input(s): TSH, T4TOTAL, FREET4, T3FREE, THYROIDAB in the last 72 hours.  Anemia Panel: No results for input(s): VITAMINB12, FOLATE, FERRITIN, TIBC, IRON, RETICCTPCT in the last 72 hours.  Urine analysis:    Component Value Date/Time   COLORURINE COLORLESS (A) 06/30/2017 0116   APPEARANCEUR CLEAR 06/30/2017 0116   LABSPEC 1.002 (L) 06/30/2017 0116   PHURINE 7.0 06/30/2017 0116   GLUCOSEU NEGATIVE 06/30/2017 0116   HGBUR NEGATIVE 06/30/2017 0116   BILIRUBINUR NEGATIVE 06/30/2017 0116   KETONESUR NEGATIVE 06/30/2017 0116   PROTEINUR NEGATIVE 06/30/2017 0116   UROBILINOGEN 0.2 08/12/2015 1800   NITRITE NEGATIVE  06/30/2017 0116   LEUKOCYTESUR NEGATIVE 06/30/2017 0116    Sepsis Labs: @LABRCNTIP (procalcitonin:4,lacticidven:4) )No results found for this or any previous visit (from the past 240 hour(s)).   Radiological Exams on Admission: Mr Lumbar Spine W Wo Contrast  Result Date: 06/30/2017 CLINICAL DATA:  Initial evaluation for severe low back pain with left buttock pain, fevers. Recent back surgery. EXAM: MRI LUMBAR SPINE WITHOUT AND WITH CONTRAST TECHNIQUE: Multiplanar and multiecho pulse sequences of the lumbar spine were obtained without and with intravenous contrast. CONTRAST:  20mL MULTIHANCE GADOBENATE DIMEGLUMINE 529 MG/ML IV SOLN COMPARISON:  Prior MRI from 04/17/2017. FINDINGS: Segmentation: Normal segmentation. Lowest well-formed disc labeled the L5-S1 level. Alignment: Vertebral bodies normally aligned with preservation of the normal lumbar lordosis. Vertebrae: Vertebral body heights are maintained. No evidence for acute or chronic fracture. Signal intensity within the vertebral body bone marrow is normal. No discrete or worrisome osseous lesions. Conus medullaris: Extends to the L1 level and appears normal. Paraspinal and other soft tissues: Postoperative changes from recent spinal surgery present within the lower back. 2.4 x 2.0 x 2.3 cm collection along the lower midline incision (series 6, image 23). Additional 2.3 x 1.0 x 1.7 cm collection at the left laminectomy site (series 6, image 27.). These collections do not appear to be contiguous. Postoperative seromas are favored Superimposed infection not excluded. Associated edema and enhancement within the left posterior paraspinous musculature. Paraspinous soft tissues otherwise within normal limits. Visualized vessel structures normal. Disc levels: L1-2:  Normal interspace.  Mild facet hypertrophy.  No stenosis. L2-3:  Normal interspace.  Mild facet hypertrophy.  No stenosis. L3-4: Mild annular disc bulge. Mild facet and ligamentum flavum  hypertrophy. Short pedicles. Mild spinal stenosis, largely due to short pedicles. No foraminal narrowing. L4-5: Sequelae of recent left hemi laminectomy with probable micro discectomy. Postoperative collection at the laminectomy site as above. Diffuse degenerative disc bulge with disc desiccation and mild intervertebral disc space narrowing. There is a persistent and/or recurrent left subarticular disc extrusion with inferior migration (series 7, image 7). Inferior migration measures 10 mm inferior to the parent L4-5 interspace. Extruded disc material extends into the left lateral recess, likely impinging upon the descending left L5 nerve root. Surrounding postoperative  enhancement and granulation tissue. Superimposed facet hypertrophy. Slightly worsened severe canal stenosis with severe left subarticular narrowing. Stable mild bilateral foraminal narrowing. L5-S1: Broad posterior disc bulge with mild marginal osteophytes. Superimposed right central disc protrusion (series 6, image 32). Mild facet arthrosis, greater on the right. Protruding disc potentially affects the descending right S1 nerve root at the level of the lateral recess. Mild bilateral L5 foraminal stenosis. IMPRESSION: 1. Sequelae of interval left laminectomy with discectomy at L4-5. Residual and/or recurrent left L4-5 subarticular disc extrusion with inferior migration, potentially impinging upon the descending left L5 nerve root. Slightly worsened severe canal stenosis at this level. 2. Small collections along the midline incision and at the laminectomy site as above. Postoperative seromas are favored, although superimposed infection could be considered in the correct clinical setting. 3. Stable disc bulge with right central disc protrusion at L5-S1, potentially affecting the transiting right S1 nerve root. Electronically Signed   By: Rise Mu M.D.   On: 06/30/2017 04:59   Dg Chest Portable 1 View  Result Date: 06/30/2017 CLINICAL  DATA:  Recent back surgery with back and leg pain EXAM: PORTABLE CHEST 1 VIEW COMPARISON:  11/02/2013 FINDINGS: The heart size and mediastinal contours are within normal limits. Both lungs are clear. The visualized skeletal structures are unremarkable. IMPRESSION: No active disease. Electronically Signed   By: Jasmine Pang M.D.   On: 06/30/2017 02:16    EKG: Independently reviewed.  Assessment/Plan Active Problems:   Intractable low back pain   Tobacco abuse   Hypertension   GERD (gastroesophageal reflux disease)   Intractable back pain due to radiculopathy. MRI shows sequelae of L4-L5 with dic extrusion impinging on L5 root and slightly worse in severe canal stenosis at that level. IV Dilaudid given with minimal control of the pain. WBC 12.  Lactic acid 1.89  Case discussed with Neurosurgery, Dr. Danielle Dess and Dr. Wynetta Emery, who recommended that the patient go back to OR for discectomy.  Placed NPOIV Zosyn and Vanc were initiated . Steroids were held due to possibility of infection   Admit to Inpatient  Pain control with IV DIlaudid q 2 h prn for severe pain and Norco 5/325 1-2 tabs q 4 h prn He will continue Mobic for now NPO for now anticipating surgery IVF 125 cc/h CBC and CMET in am  IV antiemetics and laxatives for prn narcotic induced nausea and constipation  Will need OT/PT after surgery May need IP Rehab placement, to be determined  Other plans as per Neurosurgery   Hypertension BP 140/90   Pulse 89    Continue home anti-hypertensive medications   GERD, no acute symptoms Continue PPI     Tobacco abuse with nicotine withdrawal risk Nicotine gum prn    Counseled cessation     DVT prophylaxis: SCDs Code Status:   Full   Family Communication:  Discussed with patient Disposition Plan: Expect patient to be discharged to home versus IP rehab    Consults called:   Neurosurgery per EDP Admission status: Inpatient   [facility] E, PA-C [facility]   06/30/2017, 7:43 AM

## 2017-06-30 NOTE — Consult Note (Signed)
Reason for Consult: Back and leg pain Referring Physician: Emergency department  Nicholas Choi is an 36 y.o. male.  HPI: 36 year old gentleman who is 3 weeks out from a laminectomy microdiscectomy who initially did very well however felt a pull and tweaked his back that was initially treated as a strain placed on steroids he got better but then when the steroids started weaning down he started getting more active and as he was climbing into his truck on Friday experienced much worsening back and left buttock and leg pain with tingling in his feet and toes. This got progressively worse over the weekend until patient's up in the ER early this morning underwent evaluation and subsequent MRI scan of his lumbar spine which shows a recurrent disc herniation. Patient reports pain is primarily in his left buttock and thigh and then he does experience pain goes down his leg with tingling the top of his foot. Denies any bladder issues or right leg pain.  Past Medical History:  Diagnosis Date  . Hypertension     Past Surgical History:  Procedure Laterality Date  . BACK SURGERY    . MOUTH SURGERY      History reviewed. No pertinent family history.  Social History:  reports that he has been smoking.  He has been smoking about 1.00 pack per day. He has quit using smokeless tobacco. He reports that he does not drink alcohol or use drugs.  Allergies: No Known Allergies  Medications: I have reviewed the patient's current medications.  Results for orders placed or performed during the hospital encounter of 06/30/17 (from the past 48 hour(s))  Comprehensive metabolic panel     Status: Abnormal   Collection Time: 06/30/17  1:16 AM  Result Value Ref Range   Sodium 138 135 - 145 mmol/L   Potassium 4.1 3.5 - 5.1 mmol/L   Chloride 107 101 - 111 mmol/L   CO2 22 22 - 32 mmol/L   Glucose, Bld 116 (H) 65 - 99 mg/dL   BUN 11 6 - 20 mg/dL   Creatinine, Ser 0.91 0.61 - 1.24 mg/dL   Calcium 10.9 (H) 8.9 - 10.3  mg/dL   Total Protein 7.3 6.5 - 8.1 g/dL   Albumin 4.1 3.5 - 5.0 g/dL   AST 19 15 - 41 U/L   ALT 12 (L) 17 - 63 U/L   Alkaline Phosphatase 86 38 - 126 U/L   Total Bilirubin 0.8 0.3 - 1.2 mg/dL   GFR calc non Af Amer >60 >60 mL/min   GFR calc Af Amer >60 >60 mL/min    Comment: (NOTE) The eGFR has been calculated using the CKD EPI equation. This calculation has not been validated in all clinical situations. eGFR's persistently <60 mL/min signify possible Chronic Kidney Disease.    Anion gap 9 5 - 15  CBC with Differential     Status: Abnormal   Collection Time: 06/30/17  1:16 AM  Result Value Ref Range   WBC 12.4 (H) 4.0 - 10.5 K/uL   RBC 5.68 4.22 - 5.81 MIL/uL   Hemoglobin 16.3 13.0 - 17.0 g/dL   HCT 48.2 39.0 - 52.0 %   MCV 84.9 78.0 - 100.0 fL   MCH 28.7 26.0 - 34.0 pg   MCHC 33.8 30.0 - 36.0 g/dL   RDW 13.1 11.5 - 15.5 %   Platelets 176 150 - 400 K/uL   Neutrophils Relative % 87 %   Neutro Abs 10.7 (H) 1.7 - 7.7 K/uL   Lymphocytes  Relative 11 %   Lymphs Abs 1.4 0.7 - 4.0 K/uL   Monocytes Relative 2 %   Monocytes Absolute 0.2 0.1 - 1.0 K/uL   Eosinophils Relative 0 %   Eosinophils Absolute 0.0 0.0 - 0.7 K/uL   Basophils Relative 0 %   Basophils Absolute 0.0 0.0 - 0.1 K/uL  Protime-INR     Status: None   Collection Time: 06/30/17  1:16 AM  Result Value Ref Range   Prothrombin Time 13.5 11.4 - 15.2 seconds   INR 1.03   Urinalysis, Routine w reflex microscopic     Status: Abnormal   Collection Time: 06/30/17  1:16 AM  Result Value Ref Range   Color, Urine COLORLESS (A) YELLOW   APPearance CLEAR CLEAR   Specific Gravity, Urine 1.002 (L) 1.005 - 1.030   pH 7.0 5.0 - 8.0   Glucose, UA NEGATIVE NEGATIVE mg/dL   Hgb urine dipstick NEGATIVE NEGATIVE   Bilirubin Urine NEGATIVE NEGATIVE   Ketones, ur NEGATIVE NEGATIVE mg/dL   Protein, ur NEGATIVE NEGATIVE mg/dL   Nitrite NEGATIVE NEGATIVE   Leukocytes, UA NEGATIVE NEGATIVE  I-Stat CG4 Lactic Acid, ED     Status: None    Collection Time: 06/30/17  1:42 AM  Result Value Ref Range   Lactic Acid, Venous 1.89 0.5 - 1.9 mmol/L    Mr Lumbar Spine W Wo Contrast  Result Date: 06/30/2017 CLINICAL DATA:  Initial evaluation for severe low back pain with left buttock pain, fevers. Recent back surgery. EXAM: MRI LUMBAR SPINE WITHOUT AND WITH CONTRAST TECHNIQUE: Multiplanar and multiecho pulse sequences of the lumbar spine were obtained without and with intravenous contrast. CONTRAST:  34m MULTIHANCE GADOBENATE DIMEGLUMINE 529 MG/ML IV SOLN COMPARISON:  Prior MRI from 04/17/2017. FINDINGS: Segmentation: Normal segmentation. Lowest well-formed disc labeled the L5-S1 level. Alignment: Vertebral bodies normally aligned with preservation of the normal lumbar lordosis. Vertebrae: Vertebral body heights are maintained. No evidence for acute or chronic fracture. Signal intensity within the vertebral body bone marrow is normal. No discrete or worrisome osseous lesions. Conus medullaris: Extends to the L1 level and appears normal. Paraspinal and other soft tissues: Postoperative changes from recent spinal surgery present within the lower back. 2.4 x 2.0 x 2.3 cm collection along the lower midline incision (series 6, image 23). Additional 2.3 x 1.0 x 1.7 cm collection at the left laminectomy site (series 6, image 27.). These collections do not appear to be contiguous. Postoperative seromas are favored Superimposed infection not excluded. Associated edema and enhancement within the left posterior paraspinous musculature. Paraspinous soft tissues otherwise within normal limits. Visualized vessel structures normal. Disc levels: L1-2:  Normal interspace.  Mild facet hypertrophy.  No stenosis. L2-3:  Normal interspace.  Mild facet hypertrophy.  No stenosis. L3-4: Mild annular disc bulge. Mild facet and ligamentum flavum hypertrophy. Short pedicles. Mild spinal stenosis, largely due to short pedicles. No foraminal narrowing. L4-5: Sequelae of recent  left hemi laminectomy with probable micro discectomy. Postoperative collection at the laminectomy site as above. Diffuse degenerative disc bulge with disc desiccation and mild intervertebral disc space narrowing. There is a persistent and/or recurrent left subarticular disc extrusion with inferior migration (series 7, image 7). Inferior migration measures 10 mm inferior to the parent L4-5 interspace. Extruded disc material extends into the left lateral recess, likely impinging upon the descending left L5 nerve root. Surrounding postoperative enhancement and granulation tissue. Superimposed facet hypertrophy. Slightly worsened severe canal stenosis with severe left subarticular narrowing. Stable mild bilateral foraminal narrowing. L5-S1: Broad  posterior disc bulge with mild marginal osteophytes. Superimposed right central disc protrusion (series 6, image 32). Mild facet arthrosis, greater on the right. Protruding disc potentially affects the descending right S1 nerve root at the level of the lateral recess. Mild bilateral L5 foraminal stenosis. IMPRESSION: 1. Sequelae of interval left laminectomy with discectomy at L4-5. Residual and/or recurrent left L4-5 subarticular disc extrusion with inferior migration, potentially impinging upon the descending left L5 nerve root. Slightly worsened severe canal stenosis at this level. 2. Small collections along the midline incision and at the laminectomy site as above. Postoperative seromas are favored, although superimposed infection could be considered in the correct clinical setting. 3. Stable disc bulge with right central disc protrusion at L5-S1, potentially affecting the transiting right S1 nerve root. Electronically Signed   By: Jeannine Boga M.D.   On: 06/30/2017 04:59   Dg Chest Portable 1 View  Result Date: 06/30/2017 CLINICAL DATA:  Recent back surgery with back and leg pain EXAM: PORTABLE CHEST 1 VIEW COMPARISON:  11/02/2013 FINDINGS: The heart size and  mediastinal contours are within normal limits. Both lungs are clear. The visualized skeletal structures are unremarkable. IMPRESSION: No active disease. Electronically Signed   By: Donavan Foil M.D.   On: 06/30/2017 02:16    Review of Systems  Musculoskeletal: Positive for back pain, joint pain and myalgias.  Neurological: Positive for tingling and sensory change.   Blood pressure (!) 152/94, pulse 87, temperature 98.7 F (37.1 C), temperature source Oral, resp. rate 19, height '6\' 5"'$  (1.956 m), weight (!) 156.5 kg (345 lb), SpO2 100 %. Physical Exam  Constitutional: He is oriented to person, place, and time.  Neurological: He is alert and oriented to person, place, and time. He has normal strength. GCS eye subscore is 4. GCS verbal subscore is 5. GCS motor subscore is 6.  Strength 5 out of 5 iliopsoas, quads, hip she's, gastric, into tibialis, EHL.    Assessment/Plan: 35 years and presents for reexploration of lumbar wound for evacuation of recurrent disc herniation possible lumbar infection ID with cultures. I've extensively gone over the risks and benefits of the operation with him as well as perioperative course expectations of outcome and alternatives to surgery and he understood and agreed to proceed forward.  Cully Luckow P 06/30/2017, 11:28 AM

## 2017-06-30 NOTE — ED Notes (Signed)
Pt moved onto hospital bed.  Pt endorses increased comfort.

## 2017-06-30 NOTE — Anesthesia Procedure Notes (Signed)
Procedure Name: Intubation Date/Time: 06/30/2017 3:31 PM Performed by: Salli Quarry Alexxander Kurt Pre-anesthesia Checklist: Patient identified, Emergency Drugs available, Suction available and Patient being monitored Patient Re-evaluated:Patient Re-evaluated prior to inductionOxygen Delivery Method: Circle System Utilized Preoxygenation: Pre-oxygenation with 100% oxygen Intubation Type: IV induction Laryngoscope Size: Mac and 4 Grade View: Grade I Tube type: Oral Tube size: 7.5 mm Number of attempts: 1 Airway Equipment and Method: Stylet Placement Confirmation: ETT inserted through vocal cords under direct vision,  positive ETCO2 and breath sounds checked- equal and bilateral Secured at: 24 cm Tube secured with: Tape Dental Injury: Teeth and Oropharynx as per pre-operative assessment

## 2017-06-30 NOTE — Progress Notes (Signed)
Pt admitted to the unit at 1157. Pt mental status is A&O x 4. Pt oriented to room, staff, and call bell. Skin is intact except where otherwise charted. Full assessment charted in [facility]. Call bell within reach. Visitor guidelines reviewed w/ pt and/or family.

## 2017-06-30 NOTE — ED Notes (Signed)
Attempted to call report

## 2017-06-30 NOTE — ED Notes (Signed)
Patient transported to MRI 

## 2017-06-30 NOTE — Op Note (Signed)
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  EBL: Minimal  History of present illness: Patient is a very pleasant 36 year old gentleman whose undergone an L4-5 left-sided Limited microdiscectomy back about a month ago and over the last 2 weeks has had slowly worsening back and left hip pain was placed on a steroid pack but it got acutely worse on Friday deteriorated over the weekend and presented to the ER early this morning. Workup revealed a large recurrent disc herniation. In the ER patient was running a small low-grade temperatures other possibility of infection existed. I recommended re-expiration of lumbar wound for cultures and redo discectomy as needed. Extensive and over the risks and benefits of the operation with the patient as well as perioperative course expectations of outcome and alternatives of surgery and he understood and agreed to proceed forward.  Operative procedure: Patient brought into the or was induced under general anesthesia positioned prone the Wilson frame his back was prepped and draped in routine sterile fashion is old incision was opened up the scar tissues dissected free and laminotomy defect was identified. Self-retaining retractors placed under Mike's cup illumination the undersurface the laminotomy defect was further identified I extended the foraminotomy by unroofing the L5 neuroforamen identified the L5 pedicle dissected the L5 nerve root off of the pedicle and removed several large free fragments of disc Sitton just inferior the disc space on the thecal sac. Cleaned up the disc space out with pituitary rongeurs  extended the annulotomy cleaned out some additional disc Monday to nerve root at the end of the discectomy there is no further stenosis either centrally or foraminally. The wounds and copious irrigated meticulous hemostasis was maintained I taking cultures of the extrafascial fluid as well as the disc space. The wound was then closed in layers with interrupted Vicryl skin was closed running 4 subarticular Dermabond benzo and Steri-Strips and sterile dressings applied patient recovered in stable condition. At the end the case all needle counts and sponge counts were correct.

## 2017-06-30 NOTE — Transfer of Care (Signed)
Immediate Anesthesia Transfer of Care Note  Patient: Nicholas Choi  Procedure(s) Performed: Procedure(s): LUMBAR WOUND DEBRIDEMENT OF RECURRENT DISC LEFT LUMBAR FOUR-FIVE (N/A)  Patient Location: PACU  Anesthesia Type:General  Level of Consciousness: awake, alert , oriented and patient cooperative  Airway & Oxygen Therapy: Patient Spontanous Breathing  Post-op Assessment: Report given to RN and Post -op Vital signs reviewed and stable  Post vital signs: Reviewed and stable  Last Vitals:  Vitals:   06/30/17 1206 06/30/17 1713  BP: (!) 154/99 (P) 139/86  Pulse: 84 (!) 115  Resp: 18 14  Temp: 36.9 C 36.7 C    Last Pain:  Vitals:   06/30/17 1713  TempSrc:   PainSc: (P) 0-No pain      Patients Stated Pain Goal: 2 (06/30/17 [id number])  Complications: No apparent anesthesia complications

## 2017-06-30 NOTE — ED Triage Notes (Signed)
Pt bib PTAR from home d/t left leg pain.  Denies injury to left leg, however states that he, "Over did it." approximately one week ago.  Per PTAR pt is unable sit, stand or ambulate since yesterday.

## 2017-06-30 NOTE — ED Notes (Addendum)
0.5 mg dilaudid did not infuse and was dropped. Will inform MD.

## 2017-06-30 NOTE — Anesthesia Preprocedure Evaluation (Addendum)
Anesthesia Evaluation  Patient identified by MRN, date of birth, ID band Patient awake    Reviewed: Allergy & Precautions, NPO status   Airway Mallampati: II   Neck ROM: Full    Dental no notable dental hx.    Pulmonary Current Smoker,    breath sounds clear to auscultation       Cardiovascular hypertension,  Rhythm:Regular Rate:Normal     Neuro/Psych    GI/Hepatic GERD  ,  Endo/Other  Morbid obesity  Renal/GU      Musculoskeletal   Abdominal (+) + obese,   Peds  Hematology   Anesthesia Other Findings   Reproductive/Obstetrics                            Anesthesia Physical Anesthesia Plan  ASA: III  Anesthesia Plan: General   Post-op Pain Management:    Induction: Intravenous  PONV Risk Score and Plan: 2 and Ondansetron and Dexamethasone  Airway Management Planned: Oral ETT  Additional Equipment:   Intra-op Plan:   Post-operative Plan: Extubation in OR  Informed Consent: I have reviewed the patients History and Physical, chart, labs and discussed the procedure including the risks, benefits and alternatives for the proposed anesthesia with the patient or authorized representative who has indicated his/her understanding and acceptance.     Plan Discussed with:   Anesthesia Plan Comments:         Anesthesia Quick Evaluation

## 2017-07-01 ENCOUNTER — Encounter (HOSPITAL_COMMUNITY): Payer: Self-pay | Admitting: Neurosurgery

## 2017-07-01 DIAGNOSIS — T792XXA Traumatic secondary and recurrent hemorrhage and seroma, initial encounter: Secondary | ICD-10-CM

## 2017-07-01 DIAGNOSIS — M545 Low back pain: Secondary | ICD-10-CM | POA: Diagnosis not present

## 2017-07-01 DIAGNOSIS — M5116 Intervertebral disc disorders with radiculopathy, lumbar region: Secondary | ICD-10-CM | POA: Diagnosis not present

## 2017-07-01 DIAGNOSIS — I10 Essential (primary) hypertension: Secondary | ICD-10-CM

## 2017-07-01 DIAGNOSIS — M5416 Radiculopathy, lumbar region: Secondary | ICD-10-CM

## 2017-07-01 LAB — COMPREHENSIVE METABOLIC PANEL
ALT: 11 U/L — ABNORMAL LOW (ref 17–63)
ANION GAP: 8 (ref 5–15)
AST: 17 U/L (ref 15–41)
Albumin: 3.7 g/dL (ref 3.5–5.0)
Alkaline Phosphatase: 69 U/L (ref 38–126)
BILIRUBIN TOTAL: 0.8 mg/dL (ref 0.3–1.2)
BUN: 11 mg/dL (ref 6–20)
CHLORIDE: 106 mmol/L (ref 101–111)
CO2: 24 mmol/L (ref 22–32)
Calcium: 10.8 mg/dL — ABNORMAL HIGH (ref 8.9–10.3)
Creatinine, Ser: 0.94 mg/dL (ref 0.61–1.24)
GFR calc Af Amer: >60 mL/min (ref >60)
GFR calc non Af Amer: >60 mL/min (ref >60)
GLUCOSE: 112 mg/dL — AB (ref 65–99)
POTASSIUM: 4 mmol/L (ref 3.5–5.1)
Sodium: 138 mmol/L (ref 135–145)
TOTAL PROTEIN: 6.5 g/dL (ref 6.5–8.1)

## 2017-07-01 LAB — CBC
HEMATOCRIT: 44.1 % (ref 39.0–52.0)
HEMOGLOBIN: 14.7 g/dL (ref 13.0–17.0)
MCH: 28.3 pg (ref 26.0–34.0)
MCHC: 33.3 g/dL (ref 30.0–36.0)
MCV: 85 fL (ref 78.0–100.0)
Platelets: 154 10*3/uL (ref 150–400)
RBC: 5.19 MIL/uL (ref 4.22–5.81)
RDW: 12.9 % (ref 11.5–15.5)
WBC: 12 10*3/uL — AB (ref 4.0–10.5)

## 2017-07-01 LAB — PROTIME-INR
INR: 1.04
Prothrombin Time: 13.6 seconds (ref 11.4–15.2)

## 2017-07-01 MED ORDER — TROPICAMIDE 1 % OP SOLN: 1.0000 [drp] | Freq: Once | OPHTHALMIC | Status: DC

## 2017-07-01 NOTE — Progress Notes (Signed)
Patient ID: Nicholas Choi, male   DOB: 09/10/1981, 36 y.o.   MRN: [id number] [name] is feeling much better he has got no hip her leg pain back is sore.  He is not mobilized overnight he says the nurses told  himhe had stay in bed   strength 5/5 wound clean dry and intact   mobilize the patient today with physical therapy continue Hemovac drain if the patient mobilizes well can consider discharge later today.

## 2017-07-01 NOTE — Discharge Summary (Signed)
  Physician Discharge Summary  Patient ID: Nicholas HawthorneRonald C Choi MRN: [id number] DOB/AGE: 38/06/1981 35 y.o.  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 Patient was significantly improved and preoperative pain preoperative diagnosis did include possibility of infection saw no infectious suspicious fluid intraoperatively. Discussed with infectious disease we elected to stop his antibiotics and patient be discharged with his home medications.  Consults: Significant Diagnostic Studies: Treatments: Redo laminectomy discectomy L4-5 left Discharge Exam: Blood pressure (!) 150/82, pulse 98, temperature 97.9 F (36.6 C), temperature source Oral, resp. rate 20, height 6\' 5"  (1.956 m), weight (!) 154.3 kg (340 lb 1.6 oz), SpO2 99 %. Strength out of 5 wound clean dry and intact  Disposition: Home  Discharge Instructions    Elevated toilet seat    Complete by:  As directed    Walker rolling    Complete by:  As directed      Allergies as of 07/01/2017   No Known Allergies     Medication List    TAKE these medications   acetaminophen 500 MG tablet Commonly known as:  TYLENOL Take 1,000-2,000 mg by mouth every 6 (six) hours as needed for mild pain, moderate pain or headache.   amLODipine 5 MG tablet Commonly known as:  NORVASC Take 5 mg by mouth daily.   benazepril 40 MG tablet Commonly known as:  LOTENSIN Take 40 mg by mouth daily.    cyclobenzaprine 10 MG tablet Commonly known as:  FLEXERIL Take 10 mg by mouth 3 (three) times daily as needed for muscle spasms.   HYDROcodone-acetaminophen 5-325 MG tablet Commonly known as:  NORCO/VICODIN Take 2 tablets by mouth every 4 (four) hours as needed. What changed:  reasons to take this   meloxicam 7.5 MG tablet Commonly known as:  MOBIC Take 1 tablet (7.5 mg total) by mouth daily.   ondansetron 4 MG disintegrating tablet Commonly known as:  ZOFRAN ODT Take 1 tablet (4 mg total) by mouth every 8 (eight) hours as needed for nausea or vomiting.   oxyCODONE-acetaminophen 5-325 MG tablet Commonly known as:  PERCOCET/ROXICET Take 1-2 tablets by mouth every 6 (six) hours as needed for severe pain.   pantoprazole 40 MG tablet Commonly known as:  PROTONIX Take 40 mg by mouth daily.        Signed: Ethin Drummond P 07/01/2017, 3:38 PM

## 2017-07-01 NOTE — Consult Note (Signed)
         [facility]    Date of Admission:  06/30/2017   Total days of antibiotics 2          Mr. Nicholas Choi is a 36 year old who underwent lumbar microdiscectomy on 06/05/2017. He did well initially after surgery but about one week ago developed severe pain in his lower back left buttock and down his left leg. This was associated with some numbness and tingling. He was readmitted 2 days ago. An MRI showed 2 fluid collections in the lumbar bed. There was new disc extrusion at the L4-5 level. Dr. Donalee CitrinGary Cram took him back to surgery yesterday. I discussed the operative findings with him. He felt that the fluid collections are likely old seromas. There was no purulence. He did extract multiple disc fragments and he felt that these were the probable cause for his acute pain. Operative Gram stain showed no organisms and cultures are negative to date. Based on the operative findings I will stop empiric antibiotics now.         Cliffton AstersJohn Dquan Cortopassi, MD [facility] [address] [facility] [phone] pager   [phone] cell 07/01/2017, 3:31 PM

## 2017-07-01 NOTE — Evaluation (Signed)
Physical Therapy Evaluation Patient Details Name: Nicholas HawthorneRonald C Choi MRN: [id number] DOB: 12/07/1981 Today's Date: 07/01/2017   History of Present Illness  Patient is a very pleasant 36 year old gentleman whose undergone an L4-5 left-sided Limited microdiscectomy back about a month ago and over the last 2 weeks has had slowly worsening back and left hip pain was placed on a steroid pack but it got acutely worse on Friday deteriorated over the weekend and presented to the ER on  7/9. Work up revealed a large recurrent disc herniation. Pt s/p Re-exploration of lumbar wound redo lami, microdiscectomy L4-5.  Clinical Impression  Patient is s/p above surgery resulting in the deficits listed below (see PT Problem List). Pt emotional and thankful he can walk again. But very anxious about "screwing it up" again. Went over back precautions in details. Patient will benefit from skilled PT to increase their independence and safety with mobility (while adhering to their precautions) to allow discharge to the venue listed below.     Follow Up Recommendations No PT follow up;Supervision for mobility/OOB    Equipment Recommendations  3in1 (PT)    Recommendations for Other Services       Precautions / Restrictions Precautions Precautions: Back Precaution Booklet Issued: Yes (comment) Precaution Comments: pt with verbal understanding Restrictions Weight Bearing Restrictions: No      Mobility  Bed Mobility Overal bed mobility: Needs Assistance Bed Mobility: Rolling;Sidelying to Sit Rolling: Supervision Sidelying to sit: Min guard       General bed mobility comments: v/c's for log roll technique, use of bed rail  Transfers Overall transfer level: Needs assistance Equipment used: Rolling walker (2 wheeled) Transfers: Sit to/from Stand Sit to Stand: Min guard         General transfer comment: v/c's to contract abd muscles to support back and push up with bilat  UEs  Ambulation/Gait Ambulation/Gait assistance: Min guard Ambulation Distance (Feet): 150 Feet Assistive device: Rolling walker (2 wheeled) Gait Pattern/deviations: Step-through pattern;Decreased stride length Gait velocity: slow Gait velocity interpretation: Below normal speed for age/gender General Gait Details: slow and guarded  Stairs            Wheelchair Mobility    Modified Rankin (Stroke Patients Only)       Balance Overall balance assessment: Needs assistance   Sitting balance-Leahy Scale: Good     Standing balance support: No upper extremity supported Standing balance-Leahy Scale: Fair                               Pertinent Vitals/Pain Pain Assessment: 0-10 Pain Score: 4  Pain Location: incision Pain Descriptors / Indicators: Sore Pain Intervention(s): Monitored during session    Home Living Family/patient expects to be discharged to:: Private residence Living Arrangements: Spouse/significant other Available Help at Discharge: Family;Available 24 hours/day Type of Home: House Home Access: Stairs to enter Entrance Stairs-Rails: Doctor, general practiceight;Left Entrance Stairs-Number of Steps: 2 Home Layout: One level Home Equipment: Environmental consultantWalker - 2 wheels      Prior Function Level of Independence: Needs assistance   Gait / Transfers Assistance Needed: required RW recently due to pain  ADL's / Homemaking Assistance Needed: needed help from spouse due to pain        Hand Dominance   Dominant Hand: Right    Extremity/Trunk Assessment   Upper Extremity Assessment Upper Extremity Assessment: Overall WFL for tasks assessed    Lower Extremity Assessment Lower Extremity Assessment: Overall WFL for tasks assessed  Cervical / Trunk Assessment Cervical / Trunk Assessment: Other exceptions Cervical / Trunk Exceptions: back surgery  Communication   Communication: No difficulties  Cognition Arousal/Alertness: Awake/alert Behavior During Therapy:  Anxious Overall Cognitive Status: Within Functional Limits for tasks assessed                                 General Comments: tearful due to being releived from pain and being able to walk again      General Comments General comments (skin integrity, edema, etc.): pt very anxious about "screwing up" again    Exercises     Assessment/Plan    PT Assessment Patient needs continued PT services  PT Problem List Decreased strength;Decreased activity tolerance;Decreased balance;Decreased mobility;Decreased knowledge of precautions       PT Treatment Interventions DME instruction;Gait training;Stair training;Functional mobility training;Therapeutic activities;Therapeutic exercise;Balance training    PT Goals (Current goals can be found in the Care Plan section)  Acute Rehab PT Goals Patient Stated Goal: walk again PT Goal Formulation: With patient Time For Goal Achievement: 07/08/17 Potential to Achieve Goals: Good    Frequency Min 5X/week   Barriers to discharge        Co-evaluation               AM-PAC PT "6 Clicks" Daily Activity  Outcome Measure Difficulty turning over in bed (including adjusting bedclothes, sheets and blankets)?: Total Difficulty moving from lying on back to sitting on the side of the bed? : Total Difficulty sitting down on and standing up from a chair with arms (e.g., wheelchair, bedside commode, etc,.)?: Total Help needed moving to and from a bed to chair (including a wheelchair)?: A Little Help needed walking in hospital room?: A Little Help needed climbing 3-5 steps with a railing? : A Little 6 Click Score: 12    End of Session Equipment Utilized During Treatment: Gait belt Activity Tolerance: Patient tolerated treatment well Patient left: in chair;with call bell/phone within reach Nurse Communication: Mobility status PT Visit Diagnosis: Difficulty in walking, not elsewhere classified (R26.2)    Time: 1610-9604 PT Time  Calculation (min) (ACUTE ONLY): 38 min   Charges:   PT Evaluation $PT Eval Moderate Complexity: 1 Procedure PT Treatments $Gait Training: 8-22 mins $Therapeutic Activity: 8-22 mins   PT G Codes:        Lewis Shock, PT, DPT Pager #: [phone] Office #: [phone]   Nicholas Choi 07/01/2017, 12:14 PM

## 2017-07-01 NOTE — Discharge Instructions (Signed)
No lifting bending twisting no driving

## 2017-07-01 NOTE — Progress Notes (Signed)
PROGRESS NOTE    Nicholas Choi  ZOX:[id number] DOB: 1981/07/13 DOA: 06/30/2017 PCP: Rodrigo Ran, MD       Brief Narrative:    36 year old gentleman s/p 3 weeks out from a laminectomy presents with progressive lower back pain was found to have  large recurrent disc herniation with small  fluid collection  s/p Re-exploration of lumbar wound redo lami, microdiscectomy L4-5 , fluid collection removal on 06/30/17.   Assessment & Plan:     1- POD#1 S/P Redo Lami with fluid evacuation : Cultures sent / Pending , Abx initiated in the ER , pain is controlled , start PT/OT , I will ask our expert ID input ,appreciate their help .  2-Tobacco abuse : Nicotin Patch .  3-Hypertension : Hydralazine / PRN .  4-GERD (gastroesophageal reflux disease) : Continue PPI .      DVT prophylaxis: (Lovenox)  Code Status: (Full) Family Communication: NONE   Disposition Plan: PT/TO  Consultants:   NS  ID  Procedures:   redo lami, microdiscectomy L4-5 , fluid collection removal on 06/30/17.  Antimicrobials: (specify start and planned stop date. Auto populated tables are space occupying and do not give end dates)  Vanc / Zosyn .   Subjective:  Nicholas Choi back pain is controlled he is denying any fevers or chills or nausea or vomiting states he is interested in PT/OT .  Objective: Vitals:   06/30/17 2009 07/01/17 0637 07/01/17 1044 07/01/17 1119  BP: (!) 152/76 (!) 152/88 (!) 159/99 (!) 146/100  Pulse: 95 95 (!) 112 (!) 104  Resp: 18 18 20    Temp: 98.1 F (36.7 C)  98.5 F (36.9 C)   TempSrc:   Oral   SpO2: 97% 97% 99%   Weight: (!) 154.3 kg (340 lb 1.6 oz)     Height: 6\' 5"  (1.956 m)       Intake/Output Summary (Last 24 hours) at 07/01/17 1325 Last data filed at 07/01/17 1123  Gross per 24 hour  Intake          6386.86 ml  Output             1695 ml  Net          4691.86 ml   Filed Weights   06/30/17 0036 06/30/17 1206 06/30/17 2009  Weight: (!) 156.5 kg (345 lb) (!)  152.5 kg (336 lb 3.2 oz) (!) 154.3 kg (340 lb 1.6 oz)    Examination:  General exam: NAD. Respiratory system: CTAB. Cardiovascular system: S1 & S2 heard, RRR. No JVD, murmurs, rubs, gallops or clicks. No pedal edema. Gastrointestinal system: Abdomen is nondistended, soft and nontender. No organomegaly or masses felt. Normal bowel sounds heard. Central nervous system: Alert and oriented. No focal neurological deficits. Extremities: Symmetric 5 x 5 power. Skin: No rashes, lesions or ulcers Psychiatry: Judgement and insight appear normal. Mood & affect appropriate.     Data Reviewed: I have personally reviewed following labs and imaging studies  CBC:  Recent Labs Lab 06/30/17 0116 07/01/17 0416  WBC 12.4* 12.0*  NEUTROABS 10.7*  --   HGB 16.3 14.7  HCT 48.2 44.1  MCV 84.9 85.0  PLT 176 154   Basic Metabolic Panel:  Recent Labs Lab 06/30/17 0116 07/01/17 0416  NA 138 138  K 4.1 4.0  CL 107 106  CO2 22 24  GLUCOSE 116* 112*  BUN 11 11  CREATININE 0.91 0.94  CALCIUM 10.9* 10.8*   GFR: Estimated Creatinine Clearance: 175.3 mL/min (by  C-G formula based on SCr of 0.94 mg/dL). Liver Function Tests:  Recent Labs Lab 06/30/17 0116 07/01/17 0416  AST 19 17  ALT 12* 11*  ALKPHOS 86 69  BILITOT 0.8 0.8  PROT 7.3 6.5  ALBUMIN 4.1 3.7   No results for input(s): LIPASE, AMYLASE in the last 168 hours. No results for input(s): AMMONIA in the last 168 hours. Coagulation Profile:  Recent Labs Lab 06/30/17 0116 07/01/17 0416  INR 1.03 1.04   Cardiac Enzymes: No results for input(s): CKTOTAL, CKMB, CKMBINDEX, TROPONINI in the last 168 hours. BNP (last 3 results) No results for input(s): PROBNP in the last 8760 hours. HbA1C: No results for input(s): HGBA1C in the last 72 hours. CBG: No results for input(s): GLUCAP in the last 168 hours. Lipid Profile: No results for input(s): CHOL, HDL, LDLCALC, TRIG, CHOLHDL, LDLDIRECT in the last 72 hours. Thyroid Function  Tests: No results for input(s): TSH, T4TOTAL, FREET4, T3FREE, THYROIDAB in the last 72 hours. Anemia Panel: No results for input(s): VITAMINB12, FOLATE, FERRITIN, TIBC, IRON, RETICCTPCT in the last 72 hours. Urine analysis:    Component Value Date/Time   COLORURINE COLORLESS (A) 06/30/2017 0116   APPEARANCEUR CLEAR 06/30/2017 0116   LABSPEC 1.002 (L) 06/30/2017 0116   PHURINE 7.0 06/30/2017 0116   GLUCOSEU NEGATIVE 06/30/2017 0116   HGBUR NEGATIVE 06/30/2017 0116   BILIRUBINUR NEGATIVE 06/30/2017 0116   KETONESUR NEGATIVE 06/30/2017 0116   PROTEINUR NEGATIVE 06/30/2017 0116   UROBILINOGEN 0.2 08/12/2015 1800   NITRITE NEGATIVE 06/30/2017 0116   LEUKOCYTESUR NEGATIVE 06/30/2017 0116   Sepsis Labs: @LABRCNTIP (procalcitonin:4,lacticidven:4)  ) Recent Results (from the past 240 hour(s))  Culture, blood (Routine x 2)     Status: None (Preliminary result)   Collection Time: 06/30/17  1:15 AM  Result Value Ref Range Status   Specimen Description BLOOD RIGHT HAND  Final   Special Requests   Final    BOTTLES DRAWN AEROBIC AND ANAEROBIC Blood Culture adequate volume   Culture NO GROWTH 1 DAY  Final   Report Status PENDING  Incomplete  Culture, blood (Routine x 2)     Status: None (Preliminary result)   Collection Time: 06/30/17  1:30 AM  Result Value Ref Range Status   Specimen Description BLOOD LEFT HAND  Final   Special Requests   Final    BOTTLES DRAWN AEROBIC AND ANAEROBIC Blood Culture adequate volume   Culture NO GROWTH 1 DAY  Final   Report Status PENDING  Incomplete  Surgical pcr screen     Status: None   Collection Time: 06/30/17  1:10 PM  Result Value Ref Range Status   MRSA, PCR NEGATIVE NEGATIVE Final   Staphylococcus aureus NEGATIVE NEGATIVE Final    Comment:        The Xpert SA Assay (FDA approved for NASAL specimens in patients over 73 years of age), is one component of a comprehensive surveillance program.  Test performance has been validated by [facility]  for patients greater than or equal to 71 year old. It is not intended to diagnose infection nor to guide or monitor treatment.   Aerobic/Anaerobic Culture (surgical/deep wound)     Status: None (Preliminary result)   Collection Time: 06/30/17  3:53 PM  Result Value Ref Range Status   Specimen Description WOUND  Final   Special Requests SUPERFICIAL FLUID LUMBAR 4 5 SPEC A  Final   Gram Stain   Final    RARE WBC PRESENT,BOTH PMN AND MONONUCLEAR NO ORGANISMS SEEN  Culture NO GROWTH < 24 HOURS  Final   Report Status PENDING  Incomplete  Aerobic/Anaerobic Culture (surgical/deep wound)     Status: None (Preliminary result)   Collection Time: 06/30/17  4:18 PM  Result Value Ref Range Status   Specimen Description WOUND  Final   Special Requests FLUID DISC AND EPIDURAL SPACE L4 5 SPEC B  Final   Gram Stain   Final    ABUNDANT WBC PRESENT,BOTH PMN AND MONONUCLEAR NO ORGANISMS SEEN    Culture NO GROWTH < 24 HOURS  Final   Report Status PENDING  Incomplete         Radiology Studies: Mr Lumbar Spine W Wo Contrast  Result Date: 06/30/2017 CLINICAL DATA:  Initial evaluation for severe low back pain with left buttock pain, fevers. Recent back surgery. EXAM: MRI LUMBAR SPINE WITHOUT AND WITH CONTRAST TECHNIQUE: Multiplanar and multiecho pulse sequences of the lumbar spine were obtained without and with intravenous contrast. CONTRAST:  20mL MULTIHANCE GADOBENATE DIMEGLUMINE 529 MG/ML IV SOLN COMPARISON:  Prior MRI from 04/17/2017. FINDINGS: Segmentation: Normal segmentation. Lowest well-formed disc labeled the L5-S1 level. Alignment: Vertebral bodies normally aligned with preservation of the normal lumbar lordosis. Vertebrae: Vertebral body heights are maintained. No evidence for acute or chronic fracture. Signal intensity within the vertebral body bone marrow is normal. No discrete or worrisome osseous lesions. Conus medullaris: Extends to the L1 level and appears normal. Paraspinal and other  soft tissues: Postoperative changes from recent spinal surgery present within the lower back. 2.4 x 2.0 x 2.3 cm collection along the lower midline incision (series 6, image 23). Additional 2.3 x 1.0 x 1.7 cm collection at the left laminectomy site (series 6, image 27.). These collections do not appear to be contiguous. Postoperative seromas are favored Superimposed infection not excluded. Associated edema and enhancement within the left posterior paraspinous musculature. Paraspinous soft tissues otherwise within normal limits. Visualized vessel structures normal. Disc levels: L1-2:  Normal interspace.  Mild facet hypertrophy.  No stenosis. L2-3:  Normal interspace.  Mild facet hypertrophy.  No stenosis. L3-4: Mild annular disc bulge. Mild facet and ligamentum flavum hypertrophy. Short pedicles. Mild spinal stenosis, largely due to short pedicles. No foraminal narrowing. L4-5: Sequelae of recent left hemi laminectomy with probable micro discectomy. Postoperative collection at the laminectomy site as above. Diffuse degenerative disc bulge with disc desiccation and mild intervertebral disc space narrowing. There is a persistent and/or recurrent left subarticular disc extrusion with inferior migration (series 7, image 7). Inferior migration measures 10 mm inferior to the parent L4-5 interspace. Extruded disc material extends into the left lateral recess, likely impinging upon the descending left L5 nerve root. Surrounding postoperative enhancement and granulation tissue. Superimposed facet hypertrophy. Slightly worsened severe canal stenosis with severe left subarticular narrowing. Stable mild bilateral foraminal narrowing. L5-S1: Broad posterior disc bulge with mild marginal osteophytes. Superimposed right central disc protrusion (series 6, image 32). Mild facet arthrosis, greater on the right. Protruding disc potentially affects the descending right S1 nerve root at the level of the lateral recess. Mild bilateral L5  foraminal stenosis. IMPRESSION: 1. Sequelae of interval left laminectomy with discectomy at L4-5. Residual and/or recurrent left L4-5 subarticular disc extrusion with inferior migration, potentially impinging upon the descending left L5 nerve root. Slightly worsened severe canal stenosis at this level. 2. Small collections along the midline incision and at the laminectomy site as above. Postoperative seromas are favored, although superimposed infection could be considered in the correct clinical setting. 3. Stable disc bulge with  right central disc protrusion at L5-S1, potentially affecting the transiting right S1 nerve root. Electronically Signed   By: Rise Mu M.D.   On: 06/30/2017 04:59   Dg Lumbar Spine 1 View  Result Date: 06/30/2017 CLINICAL DATA:  36 year old male for lumbar wound debridement and possible recurrent disc herniation. Subsequent encounter. EXAM: LUMBAR SPINE - 1 VIEW COMPARISON:  06/30/2017 lumbar spine MR. FINDINGS: Single intraoperative lateral view of the lumbar spine submitted for review after surgery. This reveals metallic probe at the L5 pedicle level. IMPRESSION: Metallic probe L5 pedicle level. Electronically Signed   By: Lacy Duverney M.D.   On: 06/30/2017 19:03   Dg Chest Portable 1 View  Result Date: 06/30/2017 CLINICAL DATA:  Recent back surgery with back and leg pain EXAM: PORTABLE CHEST 1 VIEW COMPARISON:  11/02/2013 FINDINGS: The heart size and mediastinal contours are within normal limits. Both lungs are clear. The visualized skeletal structures are unremarkable. IMPRESSION: No active disease. Electronically Signed   By: Jasmine Pang M.D.   On: 06/30/2017 02:16        Scheduled Meds: . amLODipine  5 mg Oral Daily  . benazepril  40 mg Oral Daily  . meloxicam  7.5 mg Oral Daily  . pantoprazole (PROTONIX) IV  40 mg Intravenous QHS  . sodium chloride flush  3 mL Intravenous Q12H   Continuous Infusions: . sodium chloride 125 mL/hr (06/30/17 0843)  .  sodium chloride 250 mL (06/30/17 2203)  . lactated ringers 10 mL/hr at 06/30/17 1338  . vancomycin 1,500 mg (07/01/17 1120)     LOS: 1 day    Time spent: 93 MINUTES    Efrain Sella, MD [facility] Pager [phone] If 7PM-7AM, please contact night-coverage [email or web address] Password [email or web address] 07/01/2017, 1:25 PM

## 2017-07-01 NOTE — Evaluation (Signed)
Occupational Therapy Evaluation and Discharge Patient Details Name: Nicholas Choi Reason MRN: [id number] DOB: 09/24/1981 Today's Date: 07/01/2017    History of Present Illness Patient is a very pleasant 36 year old gentleman whose undergone an L4-5 left-sided Limited microdiscectomy back about a month ago and over the last 2 weeks has had slowly worsening back and left hip pain was placed on a steroid pack but it got acutely worse on Friday deteriorated over the weekend and presented to the ER on  7/9. Work up revealed a large recurrent disc herniation. Pt s/p Re-exploration of lumbar wound redo lami, microdiscectomy L4-5.   Clinical Impression    Pt was assisted for LB dressing and ambulating with a borrowed RW since his recent back surgery. Pt with many questions about how to implement back precautions in ADL, IADL and mobility. Educated pt and wife extensively. Pt verbalizing and/or demonstrating understanding of all information. No further OT needs.  Follow Up Recommendations  No OT follow up    Equipment Recommendations   (Rolling walker, bariatric 3 in 1)    Recommendations for Other Services       Precautions / Restrictions Precautions Precautions: Back Precaution Booklet Issued: Yes (comment) Precaution Comments: reviewed back precautions related to ADL, IADL and mobility Restrictions Weight Bearing Restrictions: No      Mobility Bed Mobility Overal bed mobility: Needs Assistance Bed Mobility: Rolling;Sit to Sidelying Rolling: Supervision      Sit to sidelying: Min guard General bed mobility comments: assisted by guiding LEs back in to bed, educated in log roll  Transfers Overall transfer level: Needs assistance Equipment used: Standard walker Transfers: Sit to/from Stand Sit to Stand: Supervision         General transfer comment: increased time    Balance Overall balance assessment: Needs assistance   Sitting balance-Leahy Scale: Good     Standing balance  support: No upper extremity supported Standing balance-Leahy Scale: Fair                             ADL either performed or assessed with clinical judgement   ADL Overall ADL's : Needs assistance/impaired Eating/Feeding: Independent;Sitting   Grooming: Standing;Wash/dry hands;Supervision/safety Grooming Details (indicate cue type and reason): instructed in two cup method for toothbrushing Upper Body Bathing: Modified independent;Sitting Upper Body Bathing Details (indicate cue type and reason): instructed to use his long bath sponge Lower Body Bathing: Modified independent;With adaptive equipment;Sit to/from stand Lower Body Bathing Details (indicate cue type and reason): pt to use long handled bath sponge, instructed to use reacher to dry feet Upper Body Dressing : Set up;Sitting   Lower Body Dressing: Sit to/from stand;Minimal assistance;With adaptive equipment Lower Body Dressing Details (indicate cue type and reason): instructed in use of AE for LB dressing Toilet Transfer: Supervision/safety;Ambulation;BSC;RW;Requires wide/bariatric Toilet Transfer Details (indicate cue type and reason): recommended 3 in 1 for over toilet Toileting- Clothing Manipulation and Hygiene: Minimal assistance;Sit to/from stand Toileting - Clothing Manipulation Details (indicate cue type and reason): instructed to use tongs and wet wipes   Tub/Shower Transfer Details (indicate cue type and reason): educated pt that 3 in 1 can be used a shower seat Functional mobility during ADLs: Supervision/safety;Rolling walker General ADL Comments: Extensive time educating and problem solving issues in his home.     Vision Baseline Vision/History: No visual deficits Patient Visual Report: No change from baseline       Perception     Praxis  Pertinent Vitals/Pain Pain Assessment: Faces Pain Score: 4  Faces Pain Scale: Hurts little more Pain Location: incision Pain Descriptors / Indicators:  Sore Pain Intervention(s): Monitored during session;Repositioned     Hand Dominance Right   Extremity/Trunk Assessment Upper Extremity Assessment Upper Extremity Assessment: Overall WFL for tasks assessed   Lower Extremity Assessment Lower Extremity Assessment: Overall WFL for tasks assessed   Cervical / Trunk Assessment Cervical / Trunk Assessment: Other exceptions Cervical / Trunk Exceptions: back surgery   Communication Communication Communication: No difficulties   Cognition Arousal/Alertness: Awake/alert Behavior During Therapy: Anxious Overall Cognitive Status: Within Functional Limits for tasks assessed                                 General Comments: pt with many questions and concerns, fearful of injurying his back   General Comments  pt very anxious about "screwing up" again    Exercises     Shoulder Instructions      Home Living Family/patient expects to be discharged to:: Private residence Living Arrangements: Spouse/significant other Available Help at Discharge: Family;Available 24 hours/day Type of Home: House Home Access: Stairs to enter [employer] of Steps: 2 Entrance Stairs-Rails: Right;Left Home Layout: One level     Bathroom Shower/Tub: Producer, television/film/video: Standard     Home Equipment: Environmental consultant - 2 wheels;Adaptive equipment (borrowed from neighbor) [employer]: Reacher;Long-handled sponge        Prior Functioning/Environment Level of Independence: Needs assistance  Gait / Transfers Assistance Needed: required RW recently due to pain ADL's / Homemaking Assistance Needed: needed help from spouse for LB dressing            OT Problem List: Obesity;Impaired balance (sitting and/or standing);Decreased activity tolerance;Pain;Decreased knowledge of use of DME or AE;Decreased knowledge of precautions      OT Treatment/Interventions:      OT Goals(Current goals can be found in the care plan  section) Acute Rehab OT Goals Patient Stated Goal: not to reinjure back  OT Frequency:     Barriers to D/Choi:            Co-evaluation              AM-PAC PT "6 Clicks" Daily Activity     Outcome Measure Help from another person eating meals?: None Help from another person taking care of personal grooming?: A Little Help from another person toileting, which includes using toliet, bedpan, or urinal?: A Little Help from another person bathing (including washing, rinsing, drying)?: A Little Help from another person to put on and taking off regular upper body clothing?: None Help from another person to put on and taking off regular lower body clothing?: A Little 6 Click Score: 20   End of Session Equipment Utilized During Treatment: Gait belt;Rolling walker  Activity Tolerance: Patient tolerated treatment well Patient left: in bed;with call bell/phone within reach;with family/visitor present  OT Visit Diagnosis: Unsteadiness on feet (R26.81);Pain                Time: 4403-4742 OT Time Calculation (min): 62 min Charges:  OT General Charges $OT Visit: 1 Procedure OT Evaluation $OT Eval Moderate Complexity: 1 Procedure OT Treatments $Self Care/Home Management : 38-52 mins G-Codes:      Evern Bio 07/01/2017, 1:42 PM  [phone]

## 2017-07-01 NOTE — Care Management Note (Signed)
Case Management Note  Patient Details  Name: Nicholas Choi MRN: [id number] Date of Birth: 11/03/1981  Subjective/Objective:      Admitted with intractable low back pain.              Action/Plan: Plan is to d/c to home today.  Expected Discharge Date:  07/01/17               Expected Discharge Plan:  Home/Self Care  In-House Referral:     Discharge planning Services  CM Consult  Post Acute Care Choice:    Choice offered to:  Patient  DME Arranged:  Dan HumphreysWalker rolling DME Agency:  [facility]   CM informed pt and wife insurance will not cover 3in1/BSC (bariatric) . [facility], pt will have an out of cost of $228.64. [facility] stated [facility] $ [facility] Information shared with pt/wife. Wife stated she will check craig's list, family and friend for 3in1/BSC.    HH Arranged:    HH Agency:     Status of Service:  Completed, signed off  If discussed at Long Length of Stay Meetings, dates discussed:    Additional Comments:  Epifanio LeschesCole, Lynne Takemoto Hudson, RN 07/01/2017, 4:08 PM

## 2017-07-01 NOTE — Progress Notes (Signed)
Nicholas Choi discharged Home with wife per MD order.  Discharge instructions reviewed and discussed with the patient and wife by Nicholas BrowGlodean, RN, all questions and concerns answered. Copy of instructions, care notes and script for 3 N 1 DME given to patient.  Allergies as of 07/01/2017   No Known Allergies     Medication List    TAKE these medications   acetaminophen 500 MG tablet Commonly known as:  TYLENOL Take 1,000-2,000 mg by mouth every 6 (six) hours as needed for mild pain, moderate pain or headache.   amLODipine 5 MG tablet Commonly known as:  NORVASC Take 5 mg by mouth daily.   benazepril 40 MG tablet Commonly known as:  LOTENSIN Take 40 mg by mouth daily.   cyclobenzaprine 10 MG tablet Commonly known as:  FLEXERIL Take 10 mg by mouth 3 (three) times daily as needed for muscle spasms.   HYDROcodone-acetaminophen 5-325 MG tablet Commonly known as:  NORCO/VICODIN Take 2 tablets by mouth every 4 (four) hours as needed. What changed:  reasons to take this   meloxicam 7.5 MG tablet Commonly known as:  MOBIC Take 1 tablet (7.5 mg total) by mouth daily.   ondansetron 4 MG disintegrating tablet Commonly known as:  ZOFRAN ODT Take 1 tablet (4 mg total) by mouth every 8 (eight) hours as needed for nausea or vomiting.   oxyCODONE-acetaminophen 5-325 MG tablet Commonly known as:  PERCOCET/ROXICET Take 1-2 tablets by mouth every 6 (six) hours as needed for severe pain.   pantoprazole 40 MG tablet Commonly known as:  PROTONIX Take 40 mg by mouth daily.            Durable Medical Equipment        Start     Ordered   07/01/17 1544  For home use only DME 3 n 1  Once    Comments:  BARIATRIC   07/01/17 1544   07/01/17 1540  For home use only DME Walker rolling  Once    Question:  Patient needs a walker to treat with the following condition  Answer:  Intractable low back pain   07/01/17 1541      Patients skin is clean, dry and intact, no evidence of skin break  down, honey comb dressing to back CDI at d/Choi. IV site discontinued and catheter remains intact. Site without signs and symptoms of complications. Dressing and pressure applied.  Patient escorted to car by NT in a wheelchair,  no distress noted upon discharge.  Nicholas Choi 07/01/2017 6:01 PM

## 2017-07-04 LAB — AEROBIC/ANAEROBIC CULTURE (SURGICAL/DEEP WOUND): CULTURE: NO GROWTH

## 2017-07-04 LAB — AEROBIC/ANAEROBIC CULTURE W GRAM STAIN (SURGICAL/DEEP WOUND): Culture: NO GROWTH

## 2017-07-05 LAB — CULTURE, BLOOD (ROUTINE X 2)
Culture: NO GROWTH
Culture: NO GROWTH
Special Requests: ADEQUATE
Special Requests: ADEQUATE

## 2018-05-21 ENCOUNTER — Other Ambulatory Visit: Payer: Self-pay | Admitting: Internal Medicine

## 2018-05-21 DIAGNOSIS — M542 Cervicalgia: Secondary | ICD-10-CM

## 2018-06-22 ENCOUNTER — Ambulatory Visit
Admission: RE | Admit: 2018-06-22 | Discharge: 2018-06-22 | Disposition: A | Discharge status: Home / Self Care | Payer: BLUE CROSS/BLUE SHIELD | Source: Ambulatory Visit | Attending: Internal Medicine | Admitting: Internal Medicine

## 2018-06-22 DIAGNOSIS — M542 Cervicalgia: Secondary | ICD-10-CM

## 2020-03-13 ENCOUNTER — Other Ambulatory Visit: Payer: Self-pay

## 2020-03-13 ENCOUNTER — Emergency Department (HOSPITAL_COMMUNITY)
Admission: EM | Admit: 2020-03-13 | Discharge: 2020-03-13 | Disposition: A | Discharge status: Home / Self Care | Payer: BLUE CROSS/BLUE SHIELD | Attending: Emergency Medicine | Admitting: Emergency Medicine

## 2020-03-13 ENCOUNTER — Encounter (HOSPITAL_COMMUNITY): Payer: Self-pay | Admitting: *Deleted

## 2020-03-13 DIAGNOSIS — I10 Essential (primary) hypertension: Secondary | ICD-10-CM | POA: Insufficient documentation

## 2020-03-13 DIAGNOSIS — F410 Panic disorder [episodic paroxysmal anxiety] without agoraphobia: Secondary | ICD-10-CM | POA: Insufficient documentation

## 2020-03-13 DIAGNOSIS — R0602 Shortness of breath: Secondary | ICD-10-CM | POA: Insufficient documentation

## 2020-03-13 DIAGNOSIS — F17210 Nicotine dependence, cigarettes, uncomplicated: Secondary | ICD-10-CM | POA: Insufficient documentation

## 2020-03-13 DIAGNOSIS — F419 Anxiety disorder, unspecified: Secondary | ICD-10-CM | POA: Insufficient documentation

## 2020-03-13 DIAGNOSIS — F439 Reaction to severe stress, unspecified: Secondary | ICD-10-CM | POA: Insufficient documentation

## 2020-03-13 DIAGNOSIS — R0789 Other chest pain: Secondary | ICD-10-CM | POA: Insufficient documentation

## 2020-03-13 DIAGNOSIS — Z79899 Other long term (current) drug therapy: Secondary | ICD-10-CM | POA: Insufficient documentation

## 2020-03-13 MED ORDER — HYDROXYZINE HCL 25 MG PO TABS: 25.0000 mg | ORAL_TABLET | Freq: Four times a day (QID) | ORAL | 0 refills | Status: DC | PRN

## 2020-03-13 MED ORDER — HYDROXYZINE HCL 25 MG PO TABS
25.0000 mg | ORAL_TABLET | Freq: Once | ORAL | Status: AC
  Administered 2020-03-13: 17:00:00 25 mg via ORAL
  Filled 2020-03-13: qty 1

## 2020-03-13 NOTE — ED Provider Notes (Signed)
[facility]-EMERGENCY DEPT Provider Note   CSN: [id number] Arrival date & time: 03/13/20  1438     History Chief Complaint  Patient presents with  . Anxiety    Nicholas Choi is a 39 y.o. male with PMHx HTN who presents to the ED via EMS for anxiety that occurred earlier today around noon.  Patient reports he felt like his heart was racing and he felt short of breath with a heaviness on his chest that caused him to pull over to the side of the road.  He states that he had tingling in his hands and his feet as well.  Ford similar symptoms 3 days ago for which EMS was called out.  They evaluated patient at the home and believed him to have an anxiety attack.  He decided not to come to the ED at that time.  He does report that he has had increased stress at work as they are doing layoffs and he is concerned that they may fire him.  He does report family history of anxiety and depression however states that he has never had issues with this in the past.  During evaluation patient states he feels improved.  He is no longer having any of his symptoms.  States he is ready to go home and to relax.  He has a follow-up appointment with his PCP tomorrow to discuss options.  Patient denies SI, HI, AVH.  No other symptoms at this time.   The history is provided by the patient, medical records and a significant other.       Past Medical History:  Diagnosis Date  . Hypertension     Patient Active Problem List   Diagnosis Date Noted  . Intractable low back pain 06/30/2017  . Tobacco abuse 06/30/2017  . Hypertension 06/30/2017  . GERD (gastroesophageal reflux disease) 06/30/2017  . HNP (herniated nucleus pulposus), lumbar 06/30/2017  . Spinal abscess [facility])     Past Surgical History:  Procedure Laterality Date  . BACK SURGERY    . 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 MOUTH SURGERY         No family history on file.  Social History   Tobacco Use  . Smoking status: Current Every Day Smoker    Packs/day: 1.00  . Smokeless tobacco: Former Engineer, water Use Topics  . Alcohol use: No  . Drug use: No    Home Medications Prior to Admission medications   Medication Sig Start Date End Date Taking? Authorizing Provider  acetaminophen (TYLENOL) 500 MG tablet Take 1,000-2,000 mg by mouth every 6 (six) hours as needed for mild pain, moderate pain or headache.    [provider]  amLODipine (NORVASC) 5 MG tablet Take 5 mg by mouth daily.    [provider]  benazepril (LOTENSIN) 40 MG tablet Take 40 mg by mouth daily.    [provider]  cyclobenzaprine (FLEXERIL) 10 MG tablet Take 10 mg by mouth 3 (three) times daily as needed for muscle spasms.    [provider]  HYDROcodone-acetaminophen (NORCO/VICODIN) 5-325 MG tablet Take 2 tablets by mouth every 4 (four) hours as needed. Patient taking differently: Take 2 tablets by mouth every 4 (four) hours as needed for moderate pain.  03/08/17   Elson Areas, PA-C  hydrOXYzine (ATARAX/VISTARIL) 25  MG tablet Take 1 tablet (25 mg total) by mouth every 6 (six) hours as needed for anxiety. 03/13/20   Tanda Rockers, PA-C  meloxicam (MOBIC) 7.5 MG tablet Take 1 tablet (7.5 mg total) by mouth daily. 03/08/17   Elson Areas, PA-C  ondansetron (ZOFRAN ODT) 4 MG disintegrating tablet Take 1 tablet (4 mg total) by mouth every 8 (eight) hours as needed for nausea or vomiting. 08/12/15   Teressa Lower, NP  oxyCODONE-acetaminophen (PERCOCET/ROXICET) 5-325 MG per tablet Take 1-2 tablets by mouth every 6 (six) hours as needed for severe pain. 08/12/15   Teressa Lower, NP  pantoprazole (PROTONIX) 40 MG tablet Take 40 mg by mouth daily.    [provider]    Allergies    Patient has no known allergies.  Review  of Systems   Review of Systems  Constitutional: Negative for chills, diaphoresis and fever.  Respiratory: Positive for shortness of breath.   Cardiovascular: Positive for palpitations. Negative for chest pain and leg swelling.  Gastrointestinal: Negative for nausea and vomiting.  Psychiatric/Behavioral: The patient is nervous/anxious.   All other systems reviewed and are negative.   Physical Exam Updated Vital Signs BP (!) 151/103 (BP Location: Left Arm)   Pulse (!) 126   Temp 98.8 F (37.1 C) (Oral)   Resp 18   SpO2 100%   Physical Exam Vitals and nursing note reviewed.  Constitutional:      Appearance: He is obese. He is not ill-appearing or diaphoretic.  HENT:     Head: Normocephalic and atraumatic.  Eyes:     Conjunctiva/sclera: Conjunctivae normal.  Cardiovascular:     Rate and Rhythm: Regular rhythm. Tachycardia present.  Pulmonary:     Effort: Pulmonary effort is normal.     Breath sounds: Normal breath sounds. No wheezing, rhonchi or rales.  Abdominal:     Tenderness: There is no abdominal tenderness. There is no guarding or rebound.  Skin:    General: Skin is warm and dry.     Coloration: Skin is not jaundiced.  Neurological:     Mental Status: He is alert.  Psychiatric:        Mood and Affect: Mood normal.     ED Results / Procedures / Treatments   Labs (all labs ordered are listed, but only abnormal results are displayed) Labs Reviewed - No data to display  EKG EKG Interpretation  Date/Time:  Monday March 13 2020 16:12:55 EDT Ventricular Rate:  109 PR Interval:    QRS Duration: 90 QT Interval:  325 QTC Calculation: 438 R Axis:   21 Text Interpretation: Sinus tachycardia ST elev, probable normal early repol pattern Baseline wander in lead(s) V4 V6 12 Lead; Mason-Likar No significant change since prior today Confirmed by Meridee Score [phone]) on 03/13/2020 4:23:02 PM   Radiology No results found.  Procedures Procedures (including critical  care time)  Medications Ordered in ED Medications  hydrOXYzine (ATARAX/VISTARIL) tablet 25 mg (25 mg Oral Given 03/13/20 1715)    ED Course  I have reviewed the triage vital signs and the nursing notes.  Pertinent labs & imaging results that were available during my care of the patient were reviewed by me and considered in my medical decision making (see chart for details).    MDM Rules/Calculators/A&P                      39 year old male who presents to the ED today complaining of anxiety attack that occurred while driving  today.  At the follow-up this is on the road and called down.  On arrival to the ED patient states no longer has any symptoms.  He continues to be mildly tachycardic in the low 100s however states he does not have any chest pain or shortness of breath or palpitations currently.  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  No other risk factors for PE.  States he does not want to stay for blood work.  He states that he is ready to go home.  He has a follow-up appoint with his PCP tomorrow and will discuss options at that time.  Had initially wanted to get lab work on patient to ensure that it was not cardiac however given symptoms I doubt ACS at this time.  Patient is understanding of risks versus benefits of leaving without lab work.  He understands this and continues to want to leave.  Have given patient oral fluids and hydroxyzine to see if it will bring heart rate down.  Heart rate in the 90s on reevaluation.  Patient stable for discharge at this time.  He is advised to return to the ED if his symptoms occur between now and when his PCP appointment is tomorrow.  Other strict return precautions discussed.  Patient is in agreement with plan is stable for discharge home.   This note was prepared using Dragon voice recognition software and may include unintentional dictation errors due to the inherent limitations of voice recognition  software.  Final Clinical Impression(s) / ED Diagnoses Final diagnoses:  Anxiety  Panic attack    Rx / DC Orders ED Discharge Orders         Ordered    hydrOXYzine (ATARAX/VISTARIL) 25 MG tablet  Every 6 hours PRN     03/13/20 1740           Discharge Instructions     Keep appointment with your PCP as scheduled for tomorrow Take the medication as needed every 6 hours for anxiety Return to the ED IMMEDIATELY for any worsening symptoms including return of symptoms, passing out, chest pain, numbness on one side of your body, or any other concerning symptoms       Eustaquio Maize, PA-C 03/13/20 1744    Hayden Rasmussen, MD 03/13/20 [phone]

## 2020-03-13 NOTE — Discharge Instructions (Signed)
Keep appointment with your PCP as scheduled for tomorrow Take the medication as needed every 6 hours for anxiety Return to the ED IMMEDIATELY for any worsening symptoms including return of symptoms, passing out, chest pain, numbness on one side of your body, or any other concerning symptoms

## 2020-03-13 NOTE — ED Triage Notes (Addendum)
EMS reports pt pulled to side of road due to anxiety coming on, reports SHOB, palpations, tachypnea. Pt ambulatory in triage.  HR 120- 178/110-96% RA CBG 144  Pt states he had a similar episode on Thursday, did not seek medical treatment.

## 2020-03-21 ENCOUNTER — Telehealth: Payer: Self-pay | Admitting: Internal Medicine

## 2020-03-21 NOTE — Telephone Encounter (Signed)
Called patient, left vm msg regarding billing self pay.  Asked him to call back for estimates.

## 2020-03-21 NOTE — Telephone Encounter (Signed)
Patient called in regards to a referral that was sent over. He states that he is currently not working and may not have insurance nor a copay when he comes for his appointment. He wants to know what the protocol is for this. Please advise.

## 2020-03-27 ENCOUNTER — Encounter: Payer: Self-pay | Admitting: General Practice

## 2021-10-03 ENCOUNTER — Other Ambulatory Visit: Payer: Self-pay | Admitting: Internal Medicine

## 2021-10-03 DIAGNOSIS — E785 Hyperlipidemia, unspecified: Secondary | ICD-10-CM

## 2021-10-15 ENCOUNTER — Other Ambulatory Visit: Payer: Self-pay

## 2021-10-15 ENCOUNTER — Ambulatory Visit (INDEPENDENT_AMBULATORY_CARE_PROVIDER_SITE_OTHER): Payer: Self-pay | Admitting: Podiatry

## 2021-10-15 ENCOUNTER — Encounter: Payer: Self-pay | Admitting: Podiatry

## 2021-10-15 VITALS — Ht 76.0 in | Wt 350.0 lb

## 2021-10-15 DIAGNOSIS — M792 Neuralgia and neuritis, unspecified: Secondary | ICD-10-CM

## 2021-10-15 DIAGNOSIS — L84 Corns and callosities: Secondary | ICD-10-CM

## 2021-10-15 DIAGNOSIS — M79672 Pain in left foot: Secondary | ICD-10-CM

## 2021-10-15 DIAGNOSIS — B070 Plantar wart: Secondary | ICD-10-CM

## 2021-10-15 DIAGNOSIS — M79671 Pain in right foot: Secondary | ICD-10-CM

## 2021-10-15 MED ORDER — CICLOPIROX 8 % EX SOLN: Freq: Every day | CUTANEOUS | 0 refills | Status: DC

## 2021-10-15 MED ORDER — SALICYLIC ACID ER 28.5 % EX SOLN: 1.0000 "application " | Freq: Every day | CUTANEOUS | 0 refills | Status: DC

## 2021-10-16 ENCOUNTER — Telehealth: Payer: Self-pay | Admitting: Podiatry

## 2021-10-16 NOTE — Telephone Encounter (Signed)
Patient wanted to follow up about 3 prescriptions he was suppose to receive yesterday after his appointment. He only received one from pharmacy.  Please advise

## 2021-10-17 ENCOUNTER — Telehealth: Payer: Self-pay | Admitting: *Deleted

## 2021-10-17 NOTE — Telephone Encounter (Signed)
Ammie called the pharmacy today to verify which prescription the patient was needing. Misty Stanley

## 2021-10-17 NOTE — Telephone Encounter (Signed)
Patient is calling to clarify prescriptions sent to pharmacy, only received one ready (Ciclopirox-8%),said that pharmacy told her that it was  just compound w and could get OTC.Please advise.  Returned the call to patient's wife for more information and did contact the pharmacy to find out why the 2nd prescription had not been filled. Spoke with Jamesetta Orleans at pharmacy, said that the pharmacist had spoken with the patient about this but someone had given patient wrong information, prescription was for a stronger strength, will have to order today and it should be there tomorrow.Informed the patient's wife.

## 2021-10-18 NOTE — Progress Notes (Signed)
Subjective:   Patient ID: Nicholas Choi, male   DOB: 40 y.o.   MRN: [id number]   HPI 40 year old male presents the office today with with multiple concerns.  He states that he has multiple calluses as well as possible warts to his foot.  He gets calluses and pain in the ball of his foot and the wart.  On the central aspect of his foot.  He states he gets relief he tries to cut them off himself.  He is tried over-the-counter Dr. Margart Sickles corn pads which did not help.  He also does report standing, tingling, numbness which seems to be worse on the left side than the right.  He describes a bee sting sensation of the left foot.  He states that it gets worse if he sits at work and then stands back up.  He is also noticed that his toenails have started to split.  No swelling or redness or injury to the toenail sites.  No other concerns.   Review of Systems  All other systems reviewed and are negative.  Past Medical History:  Diagnosis Date   Hypertension     Past Surgical History:  Procedure Laterality Date   BACK SURGERY     LUMBAR WOUND DEBRIDEMENT N/A 06/30/2017   Procedure: LUMBAR WOUND DEBRIDEMENT OF RECURRENT DISC LEFT LUMBAR FOUR-FIVE;  Surgeon: Donalee Citrin, MD;  Location: [facility] OR;  Service: Neurosurgery;  Laterality: N/A;   MOUTH SURGERY       Current Outpatient Medications:    ciclopirox (PENLAC) 8 % solution, Apply topically at bedtime. Apply over nail and surrounding skin. Apply daily over previous coat. After seven (7) days, may remove with alcohol and continue cycle., Disp: 6.6 mL, Rfl: 0   Salicylic Acid ER 46.2 % SOLN, Apply 1 application topically at bedtime., Disp: 10 mL, Rfl: 0   acetaminophen (TYLENOL) 500 MG tablet, Take 1,000-2,000 mg by mouth every 6 (six) hours as needed for mild pain, moderate pain or headache., Disp: , Rfl:    amLODipine (NORVASC) 5 MG tablet, Take 5 mg by mouth daily., Disp: , Rfl:    benazepril (LOTENSIN) 40 MG tablet, Take 40 mg by mouth daily., Disp: ,  Rfl:    cyclobenzaprine (FLEXERIL) 10 MG tablet, Take 10 mg by mouth 3 (three) times daily as needed for muscle spasms. (Patient not taking: Reported on 10/15/2021), Disp: , Rfl:    escitalopram (LEXAPRO) 20 MG tablet, Take 20 mg by mouth daily., Disp: , Rfl:    HYDROcodone-acetaminophen (NORCO/VICODIN) 5-325 MG tablet, Take 2 tablets by mouth every 4 (four) hours as needed. (Patient not taking: Reported on 10/15/2021), Disp: 16 tablet, Rfl: 0   hydrOXYzine (ATARAX/VISTARIL) 25 MG tablet, Take 1 tablet (25 mg total) by mouth every 6 (six) hours as needed for anxiety. (Patient not taking: Reported on 10/15/2021), Disp: 12 tablet, Rfl: 0   meloxicam (MOBIC) 7.5 MG tablet, Take 1 tablet (7.5 mg total) by mouth daily. (Patient not taking: Reported on 10/15/2021), Disp: 30 tablet, Rfl: 0   ondansetron (ZOFRAN ODT) 4 MG disintegrating tablet, Take 1 tablet (4 mg total) by mouth every 8 (eight) hours as needed for nausea or vomiting. (Patient not taking: Reported on 10/15/2021), Disp: 20 tablet, Rfl: 0   orphenadrine (NORFLEX) 100 MG tablet, Take by mouth. (Patient not taking: Reported on 10/15/2021), Disp: , Rfl:    oxyCODONE-acetaminophen (PERCOCET/ROXICET) 5-325 MG per tablet, Take 1-2 tablets by mouth every 6 (six) hours as needed for severe pain. (Patient not  taking: Reported on 10/15/2021), Disp: 15 tablet, Rfl: 0   pantoprazole (PROTONIX) 40 MG tablet, Take 40 mg by mouth daily., Disp: , Rfl:   No Known Allergies        Objective:  Physical Exam  General: AAO x3, NAD  Dermatological: Diffuse hyperkeratotic lesion submetatarsal area bilaterally.  Also hyperkeratotic lesions on the plantar aspect of bilateral feet.  Upon debridement appears to be more of a callus as opposed to a true verruca.  Nails appear to be hypertrophic, dystrophic with yellow discoloration in particular the second digit toenails have a vertical split in the nail.  Vascular: Dorsalis Pedis artery and Posterior Tibial artery  pedal pulses are 2/4 bilateral with immedate capillary fill time.  There is no pain with calf compression, swelling, warmth, erythema.   Neruologic: Sensation decreased with Semmes Weinstein monofilament left side worse than right.  Musculoskeletal: PROM submetatarsal heads plantarly.  Muscular strength 5/5 in all groups tested bilateral.  Gait: Unassisted, Nonantalgic.       Assessment:   Neuritis left side worse than right, onychomycosis, hyperkeratotic lesions     Plan:  -Treatment options discussed including all alternatives, risks, and complications -Etiology of symptoms were discussed -Sharply debrided the lesions without any complications or bleeding.  Over salicylic acid. -We discussed shoes and better arch supports. -For the nail fungus ordered Penlac -Note to be sent to [facility] given the numbness in his left foot.  If not coming from his back likely refer to neurology.  He was previously seen at [facility] but not sure who he saw.  Vivi Barrack DPM

## 2021-10-23 NOTE — Telephone Encounter (Signed)
Everything is good,done

## 2021-10-25 ENCOUNTER — Other Ambulatory Visit: Payer: Self-pay

## 2021-11-13 ENCOUNTER — Ambulatory Visit
Admission: RE | Admit: 2021-11-13 | Discharge: 2021-11-13 | Disposition: A | Discharge status: Home / Self Care | Payer: No Typology Code available for payment source | Source: Ambulatory Visit | Attending: Internal Medicine | Admitting: Internal Medicine

## 2021-11-13 DIAGNOSIS — E785 Hyperlipidemia, unspecified: Secondary | ICD-10-CM

## 2021-12-18 ENCOUNTER — Ambulatory Visit: Payer: No Typology Code available for payment source | Admitting: Cardiology

## 2022-01-17 ENCOUNTER — Ambulatory Visit: Payer: No Typology Code available for payment source | Admitting: Cardiology

## 2022-04-23 ENCOUNTER — Ambulatory Visit (INDEPENDENT_AMBULATORY_CARE_PROVIDER_SITE_OTHER): Payer: 59 | Admitting: Neurology

## 2022-04-23 ENCOUNTER — Encounter: Payer: Self-pay | Admitting: Neurology

## 2022-04-23 VITALS — BP 165/106 | HR 90 | Ht 76.0 in | Wt 366.0 lb

## 2022-04-23 DIAGNOSIS — R351 Nocturia: Secondary | ICD-10-CM | POA: Diagnosis not present

## 2022-04-23 DIAGNOSIS — I10 Essential (primary) hypertension: Secondary | ICD-10-CM

## 2022-04-23 DIAGNOSIS — F17200 Nicotine dependence, unspecified, uncomplicated: Secondary | ICD-10-CM | POA: Diagnosis not present

## 2022-04-23 DIAGNOSIS — R0683 Snoring: Secondary | ICD-10-CM

## 2022-04-23 DIAGNOSIS — F411 Generalized anxiety disorder: Secondary | ICD-10-CM

## 2022-04-23 DIAGNOSIS — Z6841 Body Mass Index (BMI) 40.0 and over, adult: Secondary | ICD-10-CM | POA: Insufficient documentation

## 2022-04-23 DIAGNOSIS — M5412 Radiculopathy, cervical region: Secondary | ICD-10-CM

## 2022-04-23 DIAGNOSIS — G4719 Other hypersomnia: Secondary | ICD-10-CM | POA: Diagnosis not present

## 2022-04-23 DIAGNOSIS — R519 Headache, unspecified: Secondary | ICD-10-CM | POA: Insufficient documentation

## 2022-04-23 NOTE — Progress Notes (Signed)
? ? ?SLEEP MEDICINE CLINIC ?  ? ?Provider:  Melvyn Novas, MD  ?Primary Care Physician:  Rodrigo Ran, MD ?[address] ?[place] [address]  ? ?  ?Referring Provider: Mcneil Sober, NP at [facility] ?  ?  ?    ?Chief Complaint according to patient   ?Patient presents with:  ?  ? New Patient (Initial Visit)  ?     ?  ?  ?HISTORY OF PRESENT ILLNESS:  ?Nicholas Choi is a 41 y.o. Caucasian male patient seen here as a referral on 04/23/2022 from [facility] Mcneil Sober, NP for a Sleep Apnea Evaluation. Marland Kitchen  ?Chief concern according to patient :   Presents today for concerns of OSA, sent by Dr Laurey Morale office . His wife has witnessed him snoring, having apnea and wheezing while sleeping. He is a smoker, and obese. He has woken up gasping for air and coughing. He feels this is worse on his back but states hard to stay on side.  ?He avg 8 hrs of  fragmented sleep. 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  ? He  has a past medical history of Hypertension.- poorly abstracted records; He has a BMI of  45, is a smoker and has EDS, snoring, SOB, Choking.  ?  ?Sleep relevant medical history: Nocturia at least 3 times up to 5 ties, on HCTZ>  no Tonsillectomy but adenoid ectomy- in elementary school. 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  ?  ?Family medical /sleep history:  other family member on CPAP with OSA, insomnia, sleep walkers.  ?  ?Social history:  Patient is working as Education administrator  and lives in a household with spouse, and son and daughter, son in Social worker and  grand baby- due by September. ?The patient currently works early shifts( no night/ rotating,) ?Tobacco use- 11 ppday, since age 37.  [place].   ?ETOH use -none ,  ?Caffeine intake in form of Coffee( 1 large cup in AM 16 ounces) , Soda( 6 a day) Tea ( /) or energy drinks. ?Regular exercise in form of:  none .   ?  ?Sleep habits are as follows: The patient's dinner time is between 6.30 PM. The patient goes to bed at 10 PM and continues to sleep for intervals of 2 hours, wakes for many bathroom breaks, the first time at midnight  AM, then 2.15, 4.30.   ?The preferred sleep position is supine- but he chokes and snores and gasps, with the support of 2 pillows. Dreams are reportedly rare/ frequent/vivid.  ?5.30  AM is the usual rise time. The patient wakes up with an alarm.  ?He reports not feeling refreshed or restored in AM, with symptoms such as dry mouth, morning headaches, and residual fatigue. Naps are taken  but rarely planned - frequently, he naps in front of the TV  lasting from 10 to 25 minutes .  ?  ?Review of Systems: ?Out of a complete 14 system review, the patient complains of only the following symptoms, and all other reviewed systems are negative.:  ?Fatigue, sleepiness , snoring, NOCTURIA up to 5 times-  fragmented sleep, choking, gasping, GERD. No RLS.  ? chronic joint pain.  ?How likely are you to doze in the following situations: ?0 = not likely, 1 = slight chance, 2 = moderate chance, 3 = high chance ?  ?Sitting  and Reading? ?Watching Television? ?Sitting inactive in a public place (theater or meeting)? ?As a passenger in a car for an hour without a break? ?Lying down in the afternoon when circumstances permit? ?Sitting and talking to someone? ?Sitting quietly after lunch without alcohol? ?In a car, while stopped for a few minutes in traffic? ?  ?Total = 8-10/ 24 points  ? FSS endorsed at 34/ 63 points.  ? ?Social History  ? ?Socioeconomic History  ? Marital status: Married  ?  Spouse name: Not on file  ? Number of children: Not on file  ? Years of education: Not on file  ? Highest education level: Not on file  ?Occupational History  ? Not on file  ?Tobacco Use  ? Smoking status: Every Day  ?  Packs/day: 1.00  ?  Types: Cigarettes  ? Smokeless tobacco: Former  ?Substance and Sexual Activity  ?  Alcohol use: No  ? Drug use: No  ? Sexual activity: Yes  ?Other Topics Concern  ? Not on file  ?Social History Narrative  ? Not on file  ? ?Social Determinants of Health  ? ?Financial Resource Strain: Not on file  ?Food Insecurity: Not on file  ?Transportation Needs: Not on file  ?Physical Activity: Not on file  ?Stress: Not on file  ?Social Connections: Not on file  ? ? ?Family History  ?Problem Relation Age of Onset  ? Migraines Mother   ? Neuropathy Father   ? Diabetes Father   ? Cancer Maternal Grandfather   ? ? ?Past Medical History:  ?Diagnosis Date  ? Hypertension ?He was diagnosed with nicotine dependence, ED, coronary artery disease without angina hyperlipidemia and essential hypertension.   ? ? ?Past Surgical History:  ?Procedure Laterality Date  ? ADENOIDECTOMY    ? as a child  ? BACK SURGERY    ? LUMBAR WOUND DEBRIDEMENT N/A 06/30/2017  ? 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 MOUTH SURGERY    ?  ? ?Current Outpatient Medications on File Prior to Visit  ?Medication Sig Dispense Refill  ? acetaminophen (TYLENOL) 500 MG tablet Take 1,000-2,000 mg by mouth every 6 (six) hours as needed for mild pain, moderate pain or headache.    ? benazepril (LOTENSIN) 40 MG tablet Take 40 mg by mouth daily.    ? escitalopram (LEXAPRO) 20 MG tablet Take 20 mg by mouth daily.    ? hydrochlorothiazide (HYDRODIURIL) 25 MG tablet Take 25 mg by mouth daily.    ? naproxen sodium (ALEVE) 220 MG tablet Take 220 mg by mouth daily as needed.    ? omeprazole (PRILOSEC) 40 MG capsule Take 40 mg by mouth daily.    ? rosuvastatin (CRESTOR) 20 MG tablet Take 20 mg by mouth daily.    ? ?No current facility-administered medications on file prior to visit.  ? ? ?No Known Allergies ? ?Physical exam: ? ?Today's Vitals  ? 04/23/22 1009  ?BP: (!) 165/106  ?Pulse: 90  ?Weight: (!) 366 lb (166 kg)  ?Height: 6\' 4"  (1.93 m)   ? ?Body mass index is 44.55 kg/m?.  ? ?Wt Readings from Last 3 Encounters:  ?04/23/22 (!) 366 lb (166 kg)  ?10/15/21 (!) 350 lb (158.8 kg)  ?06/30/17 (!) 340 lb 1.6 oz (154.3 kg)  ?  ? ?Ht Readings from Last 3 Encounters:  ?04/23/22 6\' 4"  (1.93 m)  ?10/15/21 6\' 4"  (  1.93 m)  ?06/30/17 6\' 5"  (1.956 m)  ?  ?  ?General: The patient is awake, alert and appears not in acute distress. The patient is well groomed. ?Head: Normocephalic, atraumatic. Neck is supple.  ?Mallampati 2- with a huge swollen uvula, lateral tonsils and with midline elevation. ,  ?neck circumference:20. 5  inches .  52 cm / ?Nasal airflow only patent one side , right ore narrow than left-  ? 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  ?Dental status:  ?Cardiovascular:  Regular rate and cardiac rhythm by pulse,  without distended neck veins. ?Respiratory: Lungs are clear to auscultation.  ?Skin:  With evidence of ankle edema, heavily tattooed.  ?Trunk: The patient's posture is erect. ?  ?Neurologic exam : ?The patient is awake and alert, oriented to place and time.   ?Memory subjective described as intact.  ?Attention span & concentration ability appears normal.  ?Speech is fluent,  without  dysarthria, dysphonia or aphasia.  ?Mood and affect are appropriate. ?  ?Cranial nerves: no loss of smell or taste reported  ?Pupils are equal and briskly reactive to light.  ?Funduscopic exam deferred.   ?Extraocular movements in vertical and horizontal planes were intact and without nystagmus. No Diplopia. ?Visual fields by finger perimetry are intact. ?Hearing was intact to soft voice and finger rubbing.   ? Facial sensation intact to fine touch. ? Facial motor strength is symmetric and tongue and uvula move midline.  ?Neck ROM : rotation, tilt and flexion extension were normal for age and shoulder shrug was symmetrical.  ?  ?Motor exam:  Symmetric bulk, tone and ROM.   ?Normal tone without cog wheeling, symmetric grip strength . ?  ?Sensory:  Fine touch, pinprick and  vibration were tested  and  normal.  ?Proprioception tested in the upper extremities was normal. ?  ?Coordination: Rapid alternating movements in the fingers/hands were of normal speed.  ?The Finger-to-nose

## 2022-04-23 NOTE — Patient Instructions (Signed)
Steps to Quit Smoking ?Smoking tobacco is the leading cause of preventable death. It can affect almost every organ in the body. Smoking puts you and people around you at risk for many serious, long-lasting (chronic) diseases. Quitting smoking can be hard, but it is one of the best things that you can do for your health. It is never too late to quit. ?Do not give up if you cannot quit the first time. Some people need to try many times to quit. Do your best to stick to your quit plan, and talk with your doctor if you have any questions or concerns. ?How do I get ready to quit? ?Pick a date to quit. Set a date within the next 2 weeks to give you time to prepare. ?Write down the reasons why you are quitting. Keep this list in places where you will see it often. ?Tell your family, friends, and co-workers that you are quitting. Their support is important. ?Talk with your doctor about the choices that may help you quit. ?Find out if your health insurance will pay for these treatments. ?Know the people, places, things, and activities that make you want to smoke (triggers). Avoid them. ?What first steps can I take to quit smoking? ?Throw away all cigarettes at home, at work, and in your car. ?Throw away the things that you use when you smoke, such as ashtrays and lighters. ?Clean your car. Empty the ashtray. ?Clean your home, including curtains and carpets. ?What can I do to help me quit smoking? ?Talk with your doctor about taking medicines and seeing a counselor. You are more likely to succeed when you do both. ?If you are pregnant or breastfeeding: ?Talk with your doctor about counseling or other ways to quit smoking. ?Do not take medicine to help you quit smoking unless your doctor tells you to. ?Quit right away ?Quit smoking completely, instead of slowly cutting back on how much you smoke over a period of time. Stopping smoking right away may be more successful than slowly quitting. ?Go to counseling. In-person is best  if this is an option. You are more likely to quit if you go to counseling sessions regularly. ?Take medicine ?You may take medicines to help you quit. Some medicines need a prescription, and some you can buy over-the-counter. Some medicines may contain a drug called nicotine to replace the nicotine in cigarettes. Medicines may: ?Help you stop having the desire to smoke (cravings). ?Help to stop the problems that come when you stop smoking (withdrawal symptoms). ?Your doctor may ask you to use: ?Nicotine patches, gum, or lozenges. ?Nicotine inhalers or sprays. ?Non-nicotine medicine that you take by mouth. ?Find resources ?Find resources and other ways to help you quit smoking and remain smoke-free after you quit. They include: ?Online chats with a Social worker. ?Phone quitlines. ?Careers information officer. ?Support groups or group counseling. ?Text messaging programs. ?Mobile phone apps. Use apps on your mobile phone or tablet that can help you stick to your quit plan. Examples of free services include Quit Guide from the CDC and smokefree.gov ? ?What can I do to make it easier to quit? ? ?Talk to your family and friends. Ask them to support and encourage you. ?Call a phone quitline, such as 1-800-QUIT-NOW, reach out to support groups, or work with a Social worker. ?Ask people who smoke to not smoke around you. ?Avoid places that make you want to smoke, such as: ?Bars. ?Parties. ?Smoke-break areas at work. ?Spend time with people who do not smoke. ?Lower  the stress in your life. Stress can make you want to smoke. Try these things to lower stress: ?Getting regular exercise. ?Doing deep-breathing exercises. ?Doing yoga. ?Meditating. ?What benefits will I see if I quit smoking? ?Over time, you may have: ?A better sense of smell and taste. ?Less coughing and sore throat. ?A slower heart rate. ?Lower blood pressure. ?Clearer skin. ?Better breathing. ?Fewer sick days. ?Summary ?Quitting smoking can be hard, but it is one of  the best things that you can do for your health. ?Do not give up if you cannot quit the first time. Some people need to try many times to quit. ?When you decide to quit smoking, make a plan to help you succeed. ?Quit smoking right away, not slowly over a period of time. ?When you start quitting, get help and support to keep you smoke-free. ?This information is not intended to replace advice given to you by your health care provider. Make sure you discuss any questions you have with your health care provider. ?Document Revised: 11/30/2021 Document Reviewed: 11/30/2021 ?Elsevier Patient Education ? 2023 Elsevier Inc. ?Sinus Pain ? ?Sinus pain happens when your sinuses get swollen or blocked (clogged). Sinuses are spaces behind the bones of your face and forehead. You may feel pain or pressure in your face, forehead, ears, or upper teeth. Sinus pain can be mild or very bad. ?What are the causes? ?Sinus pain can result from conditions that affect your sinuses. Common causes include: ?Colds. ?Sinus infections. ?Allergies. ?What are the signs or symptoms? ?The main symptom of this condition is pain or pressure in your face, forehead, ears, or upper teeth. People who have sinus pain often have other symptoms, such as: ?Stuffed up or runny nose. ?Fever. ?Not being able to smell. ?Headache. ?Weather changes can make your symptoms worse. ?How is this treated? ?Treatment for this condition depends on the cause. Sinus pain caused by: ?A sinus infection may be treated with antibiotic medicine. ?A stuffy nose may be helped by rinsing out the nose and sinuses with a salt water solution. ?Allergies may be helped by allergy medicines and nasal sprays. ?Surgery may be needed in some cases if other treatments do not help. ?Follow these instructions at home: ?General instructions ?If told: ?Apply a warm, moist washcloth to your face. This can help to lessen pain. ?Use a nasal salt water wash. Follow the directions on the bottle or  box. ?Hydrate and humidify ?Drink enough water to keep your pee (urine) pale yellow. ?Use a humidifier if your home is dry. ?Breathe in steam for 10-15 minutes, 3-4 times a day or as told by your doctor. You can do this in the bathroom while a hot shower is running. ?Try not to spend time in cool or dry air. ?Medicines ? ?Take over-the-counter and prescription medicines only as told by your doctor. ?If you were prescribed an antibiotic medicine, take it as told by your doctor. Do not stop taking it even if you start to feel better. ?Use a nose spray if your nose feels full of mucus (congested). ?Contact a doctor if: ?You get more than one headache a week. ?Light or sound bothers you. ?You have a fever. ?You feel sick to your stomach (nauseous) or you vomit. ?Your headaches do not get better with treatment. ?Get help right away if: ?You have trouble seeing. ?You suddenly have very bad pain in your face or head. ?You start to have quick, sudden movements or shaking that you cannot control (seizure). ?You are confused. ?  You have a stiff neck. ?Summary ?Sinus pain happens when your sinuses get swollen or blocked (clogged). Sinuses are spaces behind the bones of your face and forehead. ?You may feel pain or pressure in your face, forehead, ears, or upper teeth. ?Take over-the-counter and prescription medicines only as told by your doctor. ?If told, apply a warm, moist washcloth to your face. This can help to lessen pain. ?This information is not intended to replace advice given to you by your health care provider. Make sure you discuss any questions you have with your health care provider. ?Document Revised: 11/11/2021 Document Reviewed: 11/11/2021 ?Elsevier Patient Education ? 2023 Elsevier Inc. ?Sleep Apnea ?Sleep apnea affects breathing during sleep. It causes breathing to stop for 10 seconds or more, or to become shallow. People with sleep apnea usually snore loudly. ?It can also increase the risk of: ?Heart  attack. ?Stroke. ?Being very overweight (obese). ?Diabetes. ?Heart failure. ?Irregular heartbeat. ?High blood pressure. ?The goal of treatment is to help you breathe normally again. ?What are the causes? ? ?The mos

## 2022-04-29 ENCOUNTER — Ambulatory Visit (INDEPENDENT_AMBULATORY_CARE_PROVIDER_SITE_OTHER): Payer: 59 | Admitting: Neurology

## 2022-04-29 DIAGNOSIS — R519 Headache, unspecified: Secondary | ICD-10-CM

## 2022-04-29 DIAGNOSIS — G4731 Primary central sleep apnea: Secondary | ICD-10-CM

## 2022-04-29 DIAGNOSIS — G4719 Other hypersomnia: Secondary | ICD-10-CM

## 2022-04-29 DIAGNOSIS — R0683 Snoring: Secondary | ICD-10-CM

## 2022-04-29 DIAGNOSIS — G4710 Hypersomnia, unspecified: Secondary | ICD-10-CM | POA: Diagnosis not present

## 2022-04-29 DIAGNOSIS — G4736 Sleep related hypoventilation in conditions classified elsewhere: Secondary | ICD-10-CM

## 2022-04-29 DIAGNOSIS — R351 Nocturia: Secondary | ICD-10-CM

## 2022-04-29 DIAGNOSIS — F411 Generalized anxiety disorder: Secondary | ICD-10-CM

## 2022-04-29 DIAGNOSIS — G4730 Sleep apnea, unspecified: Secondary | ICD-10-CM

## 2022-04-29 DIAGNOSIS — I10 Essential (primary) hypertension: Secondary | ICD-10-CM

## 2022-04-29 DIAGNOSIS — F17200 Nicotine dependence, unspecified, uncomplicated: Secondary | ICD-10-CM

## 2022-05-01 NOTE — Progress Notes (Signed)
?[facility] ?  ?HOME SLEEP TEST REPORT ( by Watch PAT)   ?STUDY DATE:  05-01-2022 ?  ?ORDERING CLINICIAN: Carmen Dohmeier, MD  ?REFERRING CLINICIAN: Office of Mark Perini, MD ?  ?CLINICAL INFORMATION/HISTORY: Nicholas Choi is a 41 y.o. Caucasian male patient seen on 04/23/2022 ([facility] Lindsay Lee, NP for a Sleep Apnea Evaluation.  ?Chief concern :   Presents today for concerns of OSA, sent by Dr Perini's office . His wife has witnessed him snoring, having apnea and wheezing while sleeping. He is a smoker and is obese. He has woken up gasping for air and coughing. He feels this is worse on his back but states it is hard to stay on his sides.  ?He average 8 hours of fragmented sleep. 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  ? He has a past medical history of Hypertension.- poorly abstracted records; He has a BMI of  45, is a smoker ( 1- 1.5 ppd) and has EDS, snoring, SOB, Choking, Nocturia at least 3 times up to 5 times, on HCTZ,   had no Tonsillectomy but adenoid ectomy- while in elementary school, DDD cervical spine, cervical radiculopathy. C5-6, deviated septum , frequent sinus congestion, post nasal drip- followed by ENT Dr.Newman, and he has prognathia.  ?   ?Epworth sleepiness score: 10 /24. ?  ?BMI: 44.6 kg/m? ?  ?Neck Circumference: 21" ?  ?FINDINGS: ?  ?Sleep Summary: ?  ?Total Recording Time (hours, min): Total recording time amount amounted to 8 hours and 45 minutes of which the total sleep sleep time was 7 hours and 54 minutes with 15.2% REM sleep.     ? Respiratory Indices: ?  ?Calculated pAHI (per hour):  82.6/h                         ?  ?REM pAHI:  69.8/h                                             ?  ?NREM pAHI: 85/h                           ?  ?Positional AHI: The patient slept mostly in supine position position for over over 350 minutes, AHI was 83.7/h at this position.  Followed by right-sided sleep with an AHI of 87.1/h in prone sleep  with an AHI of 62.1/h. ? ?Snoring reached a mean volume of 45 dB and was present for over 60% of total sleep time which is severe.                                                ?  ?Oxygen Saturation Statistics: ?  ?O2 Saturation Range (%):   Between a nadir at 77% oxygenation and a maximum of 100% with a mean value of 93%                                  ?  ?O2 Saturation (minutes) <89%: 40 minutes ?O2 saturation (minutes) <90%: 58 minutes     (!!!)  ?  ?  Pulse Rate Statistics:         ?  ?Pulse Range:  58 -117 minutes, mean HR is 79 bpm.             ?  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  This complex sleep apnea is also associated with severe oxygen desaturation and an overall critical level of hypoxia at night.  There was no positional component or differentiation between REM and non-REM sleep in terms of apnea severity. ?  ?RECOMMENDATION: This patient would need to have an urgent titration and given the complex nature of his underlying apnea that should have been an attended sleep study to begin with. ?I strongly recommend an inpatient positive airway pressure titration which also allows [place] to add oxygen as needed, this patient may need BiPAP or BiPAP ST given that he is more likely to develop central apneas in response to simple CPAP. ? ?Urgent in-lab titration for CPAP-other modalities-possible oxygen supplementation. ? ?Immediate smoking cessation is needed.  ? ?  ?INTERPRETING PHYSICIAN: ? ? Melvyn Novas, MD  ? ?Medical Director of [facility].  ? ? ? ? ? ? ? ? ? ? ? ? ? ? ? ? ? ? ? ? ? ? ? ?

## 2022-05-03 DIAGNOSIS — G4736 Sleep related hypoventilation in conditions classified elsewhere: Secondary | ICD-10-CM | POA: Insufficient documentation

## 2022-05-03 DIAGNOSIS — G4731 Primary central sleep apnea: Secondary | ICD-10-CM | POA: Insufficient documentation

## 2022-05-03 DIAGNOSIS — G4730 Sleep apnea, unspecified: Secondary | ICD-10-CM | POA: Insufficient documentation

## 2022-05-03 NOTE — Procedures (Signed)
?[facility] ?  ?HOME SLEEP TEST REPORT ( by Watch PAT)   ?STUDY DATE:  05-01-2022 ?  ?ORDERING CLINICIAN: Melvyn Novas, MD  ?REFERRING CLINICIAN: Office of Rodrigo Ran, MD ?  ?CLINICAL INFORMATION/HISTORY: Nicholas Choi is a 41 y.o. Caucasian male patient seen on 04/23/2022 ([facility] Mcneil Sober, NP for a Sleep Apnea Evaluation.  ?Chief concern :   Presents today for concerns of OSA, sent by Dr Perini's office . His wife has witnessed him snoring, having apnea and wheezing while sleeping. He is a smoker and is obese. He has woken up gasping for air and coughing. He feels this is worse on his back but states it is hard to stay on his sides.  ?He average 8 hours of fragmented sleep. 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  ? He has a past medical history of Hypertension.- poorly abstracted records; He has a BMI of  45, is a smoker ( 1- 1.5 ppd) and has EDS, snoring, SOB, Choking, Nocturia at least 3 times up to 5 times, on HCTZ,   had no Tonsillectomy but adenoid ectomy- while in elementary school, DDD cervical spine, cervical radiculopathy. C5-6, deviated septum , frequent sinus congestion, post nasal drip- followed by ENT Dr.Newman, and he has prognathia.  ?   ?Epworth sleepiness score: 10 /24. ?  ?BMI: 44.6 kg/m? ?  ?Neck Circumference: 21" ?  ?FINDINGS: ?  ?Sleep Summary: ?  ?Total Recording Time (hours, min): Total recording time amount amounted to 8 hours and 45 minutes of which the total sleep sleep time was 7 hours and 54 minutes with 15.2% REM sleep.     ? Respiratory Indices: ?  ?Calculated pAHI (per hour):  82.6/h                         ?  ?REM pAHI:  69.8/h                                             ?  ?NREM pAHI: 85/h                           ?  ?Positional AHI: The patient slept mostly in supine position position for over over 350 minutes, AHI was 83.7/h at this position.  Followed by right-sided sleep with an AHI of 87.1/h in prone sleep  with an AHI of 62.1/h. ? ?Snoring reached a mean volume of 45 dB and was present for over 60% of total sleep time which is severe.                                                ?  ?Oxygen Saturation Statistics: ?  ?O2 Saturation Range (%):   Between a nadir at 77% oxygenation and a maximum of 100% with a mean value of 93%                                  ?  ?O2 Saturation (minutes) <89%: 40 minutes ?O2 saturation (minutes) <90%: 58 minutes     (!!!)  ?  ?  Pulse Rate Statistics:         ?  ?Pulse Range:  58 -117 minutes, mean HR is 79 bpm.             ?  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  This complex sleep apnea is also associated with severe oxygen desaturation and an overall critical level of hypoxia at night.  There was no positional component or differentiation between REM and non-REM sleep in terms of apnea severity. ?  ?RECOMMENDATION: This patient would need to have an urgent titration and given the complex nature of his underlying apnea that should have been an attended sleep study to begin with. ?I strongly recommend an inpatient positive airway pressure titration which also allows [place] to add oxygen as needed, this patient may need BiPAP or BiPAP ST given that he is more likely to develop central apneas in response to simple CPAP. ? ?Urgent in-lab titration for CPAP-other modalities-possible oxygen supplementation. ? ?Immediate smoking cessation is needed.  ? ?  ?INTERPRETING PHYSICIAN: ? ? Melvyn Novas, MD  ? ?Medical Director of [facility].  ? ? ? ? ? ? ? ? ? ? ? ? ? ? ? ? ? ? ? ? ? ? ? ?

## 2022-05-03 NOTE — Addendum Note (Signed)
Addended by: Melvyn Novas on: 05/03/2022 01:09 PM ? ? Modules accepted: Orders ? ?

## 2022-05-06 ENCOUNTER — Telehealth: Payer: Self-pay | Admitting: *Deleted

## 2022-05-06 NOTE — Progress Notes (Signed)
Insurance will not approve this request for in lab titration at this time. Pt will have to try autoPAP first. Then, if there are any issues, we can bring in for titration study. ? ?I had to order auto CPAP.  Auto CPAP 5-20 cm water 2 cm EPR and mask to be fitted, Rv in 30-90 days on therapy.

## 2022-05-06 NOTE — Addendum Note (Signed)
Addended by: Melvyn Novas on: 05/06/2022 01:13 PM ? ? Modules accepted: Orders ? ?

## 2022-05-06 NOTE — Telephone Encounter (Signed)
I called pt. I advised pt that Dr. Vickey Huger reviewed their sleep study results and found that pt has severe sleep apnea. Dr. Vickey Huger recommends that pt start CPAP. I reviewed PAP compliance expectations with the pt. Pt is agreeable to starting a CPAP. I advised pt that an order will be sent to a DME, [facility], and [facility] will call the pt within about one week after they file with the pt's insurance. [facility] will show the pt how to use the machine, fit for masks, and troubleshoot the CPAP if needed. A follow up appt was made for insurance purposes with Dr. Vickey Huger on 07/31/22 at 3:30pm. Pt verbalized understanding to arrive 15 minutes early and bring their CPAP. A letter with all of this information in it will be mailed to the pt as a reminder. I verified with the pt that the address we have on file is correct. Pt verbalized understanding of results. Pt had no questions at this time but was encouraged to call back if questions arise. I have sent the order to [facility] and have received confirmation that they have received the order. ? ?

## 2022-05-06 NOTE — Telephone Encounter (Signed)
-----   Message from Hilton Cork sent at 05/06/2022  8:41 AM EDT ----- ?Insurance will not approve this request for in lab titration at this time. Pt will have to try autoPAP first. Then if there are any issues, we can bring in for titration study ?----- Message ----- ?From: Melvyn Novas, MD ?Sent: 05/03/2022   1:09 PM EDT ?To: Rodrigo Ran, MD, Hilton Cork, # ? ?Calculated pAHI (per hour):  82.6/h      42.7 % CSR.  [place]adir 77% and total hypoxia time under 90% 58 minutes (!) ?IMPRESSION:  This HST confirms the presence of severe sleep apnea with a high component of central sleep apnea and Cheyne-Stokes respiration.  This complex sleep apnea is also associated with severe oxygen desaturation and an overall critical level of hypoxia at night.  There was no positional component or differentiation between REM and non-REM sleep in terms of apnea severity. ?? ?RECOMMENDATION: This patient would need to have an urgent titration and given the complex nature of his underlying apnea that should have been an attended sleep study to begin with. ?I strongly recommend an inpatient positive airway pressure titration which also allows [place] to add oxygen as needed, this patient may need BiPAP or BiPAP ST given that he is more likely to develop central apneas in response to simple CPAP. ?? ?Urgent in-lab titration for CPAP-other modalities-possible oxygen supplementation. ?? ?Immediate smoking cessation is needed.  ? ?If [employer] refuses in lab titration, will order 5-20 cm humidified CPAP auto with mask of choice and ONO within 30 days of use-  ? ? ?

## 2022-07-12 IMAGING — CT CT CARDIAC CORONARY ARTERY CALCIUM SCORE
3 series · 14 of 20 positions shown, 16 images · non-contrast
Comparison: None.

CLINICAL DATA: 40-year-old white male with hyperlipidemia,
unspecified hyperlipidemia type.

EXAM:
CT CARDIAC CORONARY ARTERY CALCIUM SCORE
TECHNIQUE: Non-contrast imaging through the heart was performed using
prospective ECG gating. Image post processing was performed on an
independent workstation, allowing for quantitative analysis of the
heart and coronary arteries. Note that this exam targets the heart
and the chest was not imaged in its entirety.

[Series 2: calcium scoring 2.00 qr36 bestdiast 69% hrt calciu · axial · 0.51mm/px · z∈[+1762,+1840]mm · 4 of 67 slices shown]
[im 14/67  vessel]
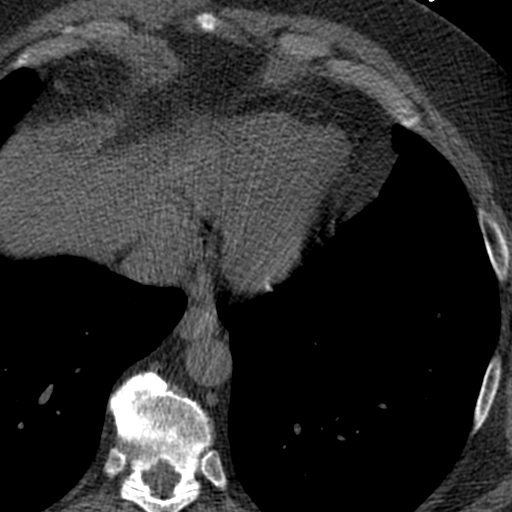
[im 27/67  vessel]
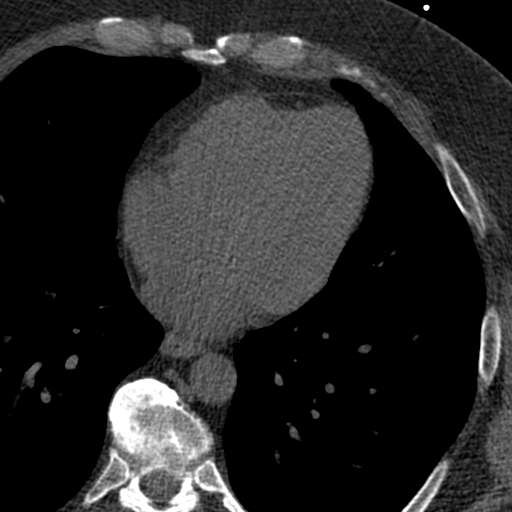
[im 40/67  vessel]
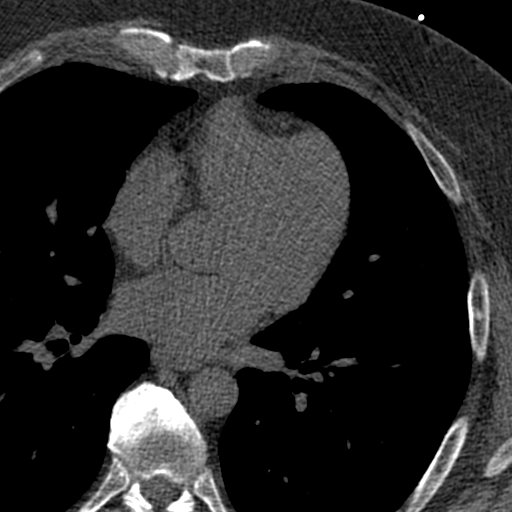
[im 53/67  vessel]
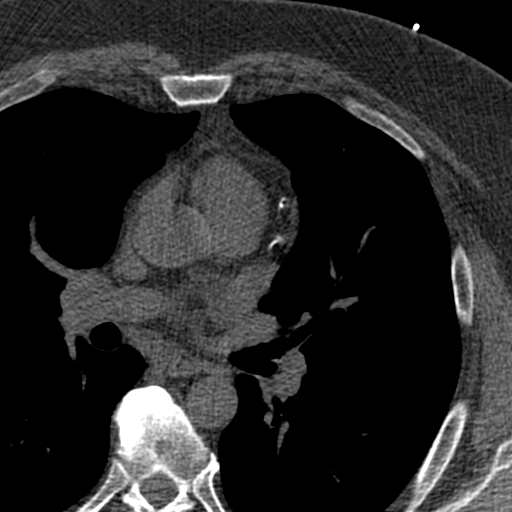

[Series 3: calcium scoring 2.00 br40 bestdiast 69% axial · axial · 0.75mm/px · z∈[+1758,+1846]mm · 5 of 67 slices shown, 7 images]
[im 12/67  vessel]
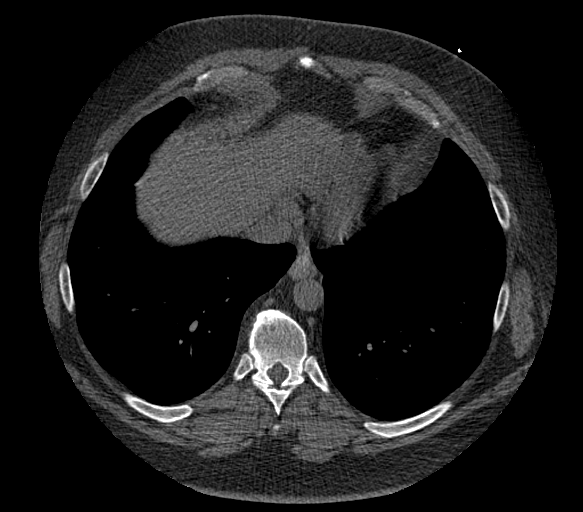
[im 12/67  lung]
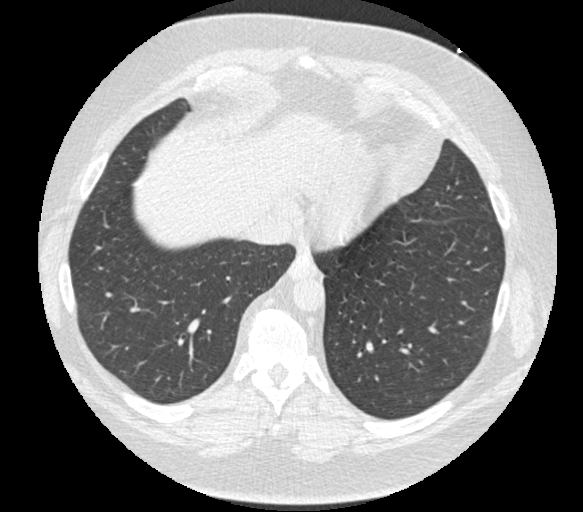
[im 23/67  vessel]
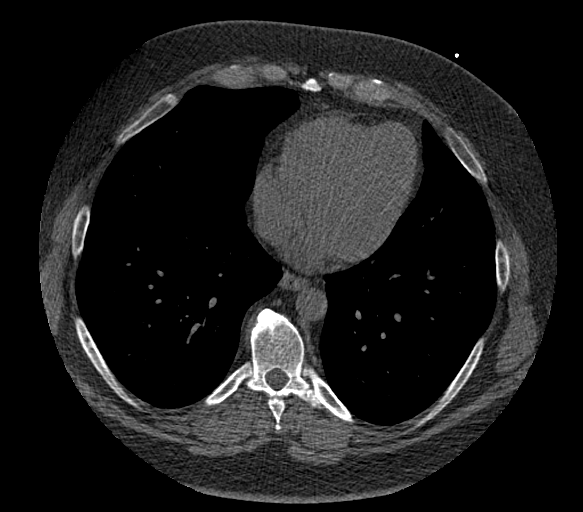
[im 34/67  vessel]
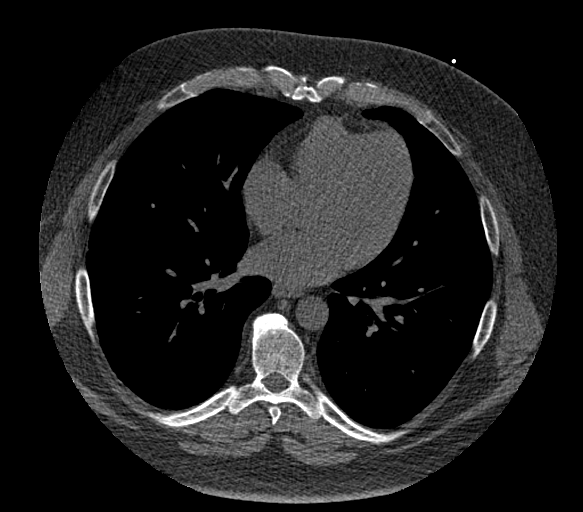
[im 45/67  vessel]
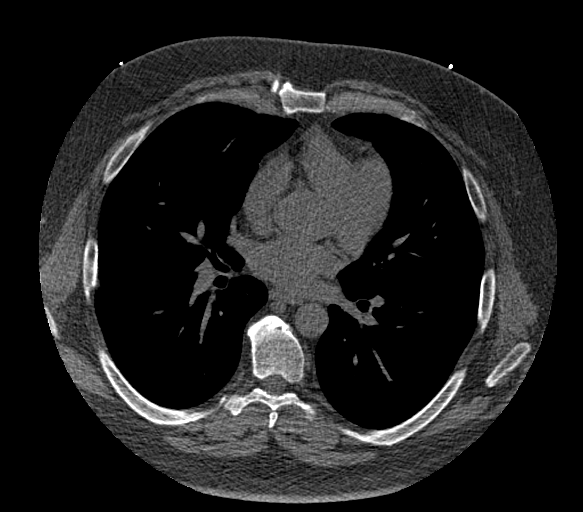
[im 56/67  vessel]
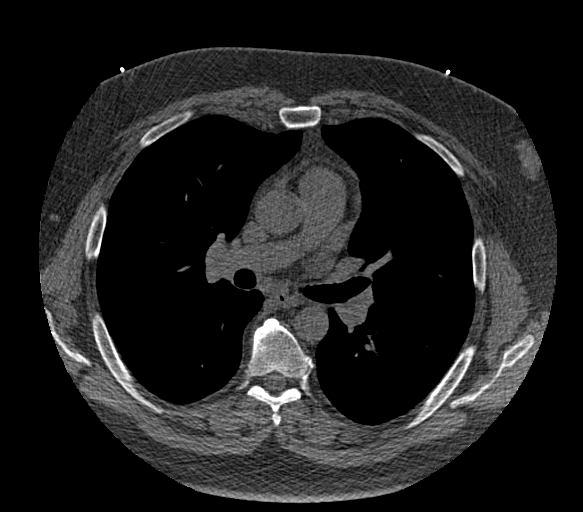
[im 56/67  lung]
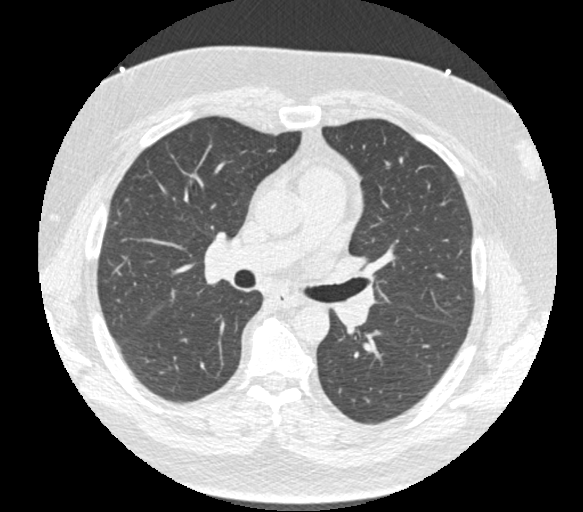

[Series 9: calcium scoring 2.00 br60 bestdiast 69% lungs · axial · 0.75mm/px · z∈[+1758,+1846]mm · 5 of 67 slices shown]
[im 12/67  vessel]
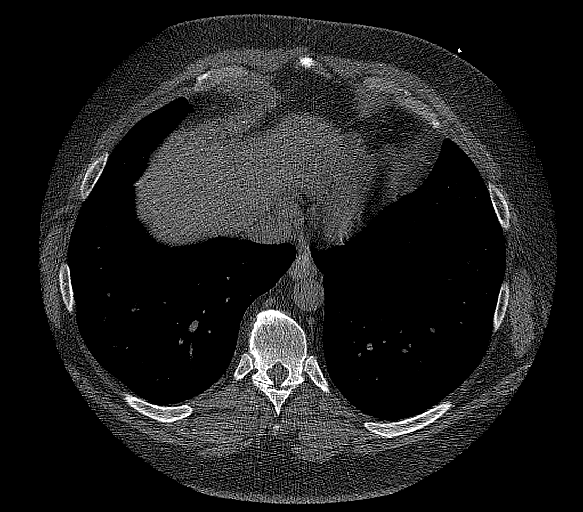
[im 23/67  vessel]
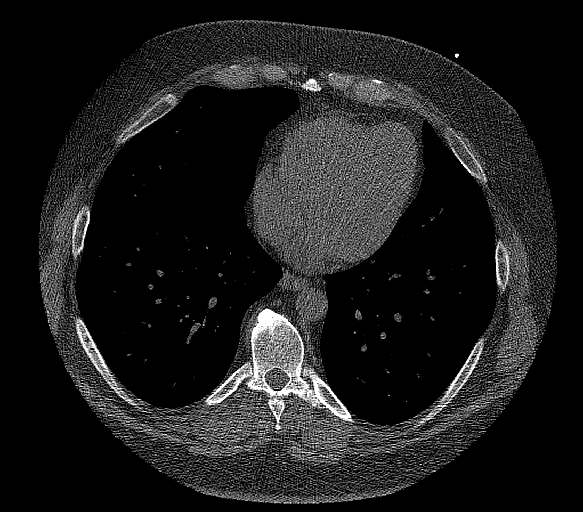
[im 34/67  vessel]
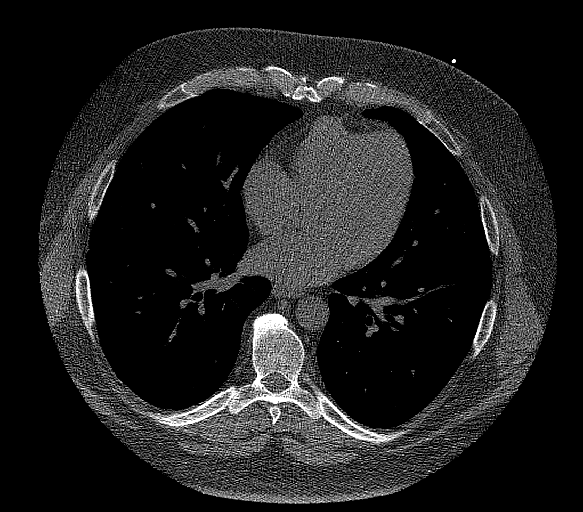
[im 45/67  vessel]
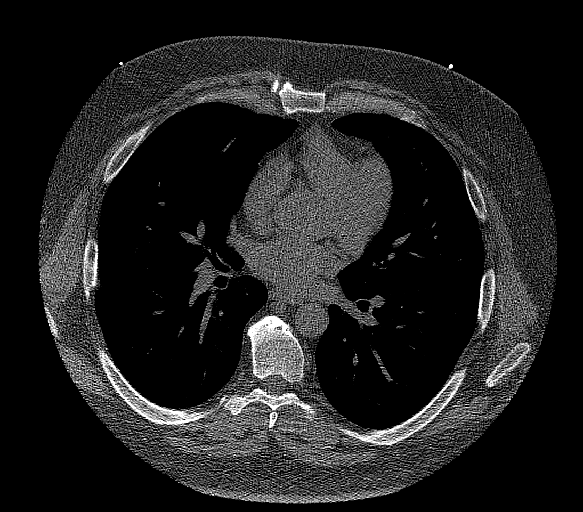
[im 56/67  vessel]
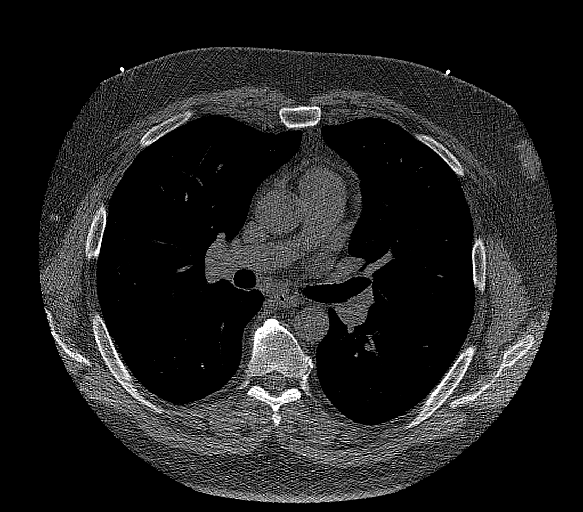

[14 of 20 positions shown; findings below may reference images not displayed]

FINDINGS: CORONARY CALCIUM SCORES:

Left Main: 0

LAD: 182

LCx: 0

RCA: 0

Total Agatston Score: 182

[HOSPITAL] percentile: 97

AORTA MEASUREMENTS:

Ascending Aorta: 36 mm

Descending Aorta: 26 mm

OTHER FINDINGS:

Visualized mediastinal structures are normal. Heart size is normal.
No significant pericardial fluid. Images of the upper abdomen are
unremarkable. Visualized lungs are clear. No large pleural
effusions. Bridging osteophytes in the thoracic spine. No acute bone
abnormality.
IMPRESSION: Age advanced coronary artery calcium. Coronary calcium score is 182
and this is at percentile 97 for patients of the same age, gender
and ethnicity.

## 2022-07-31 ENCOUNTER — Ambulatory Visit: Payer: 59 | Admitting: Neurology
